# Patient Record
Sex: Male | Born: 1960 | Race: White | Hispanic: No | Smoking: Current every day smoker
Health system: Southern US, Community
[De-identification: ages and names within clinical notes are randomized; demographics above are authoritative.]

## PROBLEM LIST (undated history)

## (undated) DIAGNOSIS — Z9049 Acquired absence of other specified parts of digestive tract: Secondary | ICD-10-CM

## (undated) DIAGNOSIS — M1990 Unspecified osteoarthritis, unspecified site: Secondary | ICD-10-CM

## (undated) DIAGNOSIS — E039 Hypothyroidism, unspecified: Secondary | ICD-10-CM

## (undated) DIAGNOSIS — I519 Heart disease, unspecified: Secondary | ICD-10-CM

## (undated) DIAGNOSIS — K219 Gastro-esophageal reflux disease without esophagitis: Secondary | ICD-10-CM

## (undated) DIAGNOSIS — R0600 Dyspnea, unspecified: Secondary | ICD-10-CM

## (undated) DIAGNOSIS — G4730 Sleep apnea, unspecified: Secondary | ICD-10-CM

## (undated) DIAGNOSIS — I2510 Atherosclerotic heart disease of native coronary artery without angina pectoris: Secondary | ICD-10-CM

## (undated) DIAGNOSIS — D496 Neoplasm of unspecified behavior of brain: Secondary | ICD-10-CM

## (undated) DIAGNOSIS — J449 Chronic obstructive pulmonary disease, unspecified: Secondary | ICD-10-CM

## (undated) DIAGNOSIS — I10 Essential (primary) hypertension: Secondary | ICD-10-CM

## (undated) DIAGNOSIS — I219 Acute myocardial infarction, unspecified: Secondary | ICD-10-CM

## (undated) DIAGNOSIS — I2699 Other pulmonary embolism without acute cor pulmonale: Secondary | ICD-10-CM

## (undated) DIAGNOSIS — E119 Type 2 diabetes mellitus without complications: Secondary | ICD-10-CM

## (undated) DIAGNOSIS — E7800 Pure hypercholesterolemia, unspecified: Secondary | ICD-10-CM

## (undated) HISTORY — DX: Pure hypercholesterolemia, unspecified: E78.00

## (undated) HISTORY — DX: Heart disease, unspecified: I51.9

## (undated) HISTORY — DX: Essential (primary) hypertension: I10

## (undated) HISTORY — DX: Other pulmonary embolism without acute cor pulmonale: I26.99

## (undated) HISTORY — PX: APPENDECTOMY: SHX54

## (undated) HISTORY — DX: Chronic obstructive pulmonary disease, unspecified: J44.9

## (undated) HISTORY — DX: Sleep apnea, unspecified: G47.30

## (undated) HISTORY — DX: Neoplasm of unspecified behavior of brain: D49.6

## (undated) HISTORY — DX: Acquired absence of other specified parts of digestive tract: Z90.49

## (undated) HISTORY — DX: Type 2 diabetes mellitus without complications: E11.9

---

## 2000-06-17 ENCOUNTER — Ambulatory Visit (HOSPITAL_COMMUNITY): Admission: RE | Admit: 2000-06-17 | Discharge: 2000-06-18 | Payer: Self-pay | Admitting: Neurosurgery

## 2000-07-14 ENCOUNTER — Inpatient Hospital Stay (HOSPITAL_COMMUNITY): Admission: RE | Admit: 2000-07-14 | Discharge: 2000-07-16 | Payer: Self-pay | Admitting: Neurosurgery

## 2000-10-25 ENCOUNTER — Encounter: Payer: Self-pay | Admitting: Neurosurgery

## 2000-10-25 ENCOUNTER — Ambulatory Visit (HOSPITAL_COMMUNITY): Admission: RE | Admit: 2000-10-25 | Discharge: 2000-10-25 | Payer: Self-pay | Admitting: Neurosurgery

## 2001-10-13 HISTORY — PX: TRANSPHENOIDAL PITUITARY RESECTION: SHX2572

## 2002-04-19 ENCOUNTER — Ambulatory Visit (HOSPITAL_COMMUNITY): Admission: RE | Admit: 2002-04-19 | Discharge: 2002-04-19 | Payer: Self-pay | Admitting: Family Medicine

## 2002-04-19 ENCOUNTER — Encounter: Payer: Self-pay | Admitting: Family Medicine

## 2011-12-16 ENCOUNTER — Encounter: Payer: Self-pay | Admitting: Surgery

## 2011-12-17 ENCOUNTER — Encounter: Payer: Self-pay | Admitting: Surgery

## 2011-12-30 ENCOUNTER — Ambulatory Visit (INDEPENDENT_AMBULATORY_CARE_PROVIDER_SITE_OTHER): Payer: Medicare Other | Admitting: Cardiothoracic Surgery

## 2011-12-30 ENCOUNTER — Encounter: Payer: Self-pay | Admitting: Cardiothoracic Surgery

## 2011-12-30 VITALS — BP 130/78 | HR 74 | Resp 18

## 2011-12-30 DIAGNOSIS — E119 Type 2 diabetes mellitus without complications: Secondary | ICD-10-CM | POA: Insufficient documentation

## 2011-12-30 DIAGNOSIS — I519 Heart disease, unspecified: Secondary | ICD-10-CM

## 2011-12-30 DIAGNOSIS — J984 Other disorders of lung: Secondary | ICD-10-CM

## 2011-12-30 DIAGNOSIS — G4730 Sleep apnea, unspecified: Secondary | ICD-10-CM

## 2011-12-30 DIAGNOSIS — I10 Essential (primary) hypertension: Secondary | ICD-10-CM | POA: Insufficient documentation

## 2011-12-30 DIAGNOSIS — J449 Chronic obstructive pulmonary disease, unspecified: Secondary | ICD-10-CM | POA: Insufficient documentation

## 2011-12-30 DIAGNOSIS — Z86711 Personal history of pulmonary embolism: Secondary | ICD-10-CM

## 2011-12-30 DIAGNOSIS — E7800 Pure hypercholesterolemia, unspecified: Secondary | ICD-10-CM

## 2011-12-30 HISTORY — DX: Chronic obstructive pulmonary disease, unspecified: J44.9

## 2011-12-30 HISTORY — DX: Other disorders of lung: J98.4

## 2011-12-30 HISTORY — DX: Personal history of pulmonary embolism: Z86.711

## 2011-12-30 NOTE — Progress Notes (Signed)
[address]            [place] [address]          [phone]      Matthew Adams [place] Medical Record [id number] Date of Birth: 06-27-1961  Referring: Gardiner Rhyme, MD Primary Care: No primary provider on file.  Chief Complaint:    Chief Complaint  Patient presents with  . 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 These recently quit smoking which she had been doing for many years in addition to occupational exposure to significant amount of wood dust. He denies any direct exposure to tuberculosis or any history of tuberculosis. He notes that recent hospitalization he had a skin test done for tuberculosis reportedly negative. Recent CT scan shows some collapse of the predominate cystic areas in the right upper lobe. The patient has no current pulmonary symptoms other than chronic shortness of breath with exertion. He's had no fever chills or other indications of pulmonary infection. He sent for thoracic consultation and consideration of resection of right upper lobe.          Past Medical History  Diagnosis Date  . Diabetes mellitus   . Heart disease     Coronary stents placed 2006  . History of appendectomy   . Hypertension   . Sleep apnea   . Hypercholesterolemia   . 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 Pulmonary embolism   . COPD (chronic obstructive pulmonary disease)     Past Surgical History  Procedure Date  . Appendectomy   . Transphenoidal pituitary resection 2003    recurrance treated with gamma knife    Family History  Problem Relation Age of Onset  . Heart disease Father        History  Smoking status  . Former Smoker -- 2.0 packs/day for 25 years  . Quit date: 11/10/2011  Smokeless tobacco  . Never Used    History  Alcohol Use No     Allergies no known allergies  Current Outpatient Prescriptions  Medication Sig Dispense Refill  . CLINDAMYCIN HCL PO Take 300 mg by mouth every 8 (eight) hours.      . Fluticasone-Salmeterol (ADVAIR DISKUS) 250-50 MCG/DOSE AEPB Inhale 1 puff into the lungs 2 (two) times daily.      Marland Kitchen ipratropium-albuterol (DUONEB) 0.5-2.5 (3) MG/3ML SOLN Take 3 mLs by nebulization every 4 (four) hours as needed.      . warfarin (COUMADIN) 10 MG tablet Take 10 mg by mouth. 15mg  M,W,F  10mg  Tue,Thur, and Sun           Review of Systems:     Cardiac Review of Systems: Y or N  Chest Pain [ n   ]  Resting SOB [n   ] Exertional SOB  Blue.Reese  ]  Orthopnea Blue.Reese  ]   Pedal Edema [  n ]    Palpitations Florencio.Farrier  ] Syncope  [ n ]   Presyncope [ n  ]  General  Review of Systems: [Y] = yes [  ]=no Constitional: recent weight change [  n]; anorexia Blue.Reese  ]; fatigue [ n ]; nausea [n  ]; night sweats [ n ]; fever [  n]; or chills [ n ];                                                                                                                                          Dental: poor dentition[ n ];   Eye : blurred vision [  y]; diplopia [  y ]; vision changes [ y ];  Amaurosis fugax[  ]; Resp: cough [  y];  wheezing[y  ];  hemoptysis[n]; shortness of breath[ y ]; paroxysmal nocturnal dyspnea[  ]; dyspnea on exertion[  ]; or orthopnea[  ];  GI:  gallstones[  ], vomiting[  ];  dysphagia[  ]; melena[  ];  hematochezia [  ]; heartburn[  ];   Hx of  Colonoscopy[  ]; GU: kidney stones [  ]; hematuria[  ];   dysuria [  ];  nocturia[  ];  history of     obstruction [  ];             Skin: rash, swelling[  ];, hair loss[  ];  peripheral edema[  ];  or itching[  ]; Musculosketetal: myalgias[  ];  joint swelling[  ];  joint erythema[  ];  joint pain[  ];  back pain[   ];  Heme/Lymph: bruising[  ];  bleeding[  ];  anemia[  ];  Neuro: TIA[  ];  headaches[  ];  stroke[  ];  vertigo[  ];  seizures[  ];   paresthesias[  ];  difficulty walking[  ];  Psych:depression[  ]; anxiety[  ];  Endocrine: diabetes[  ];  thyroid dysfunction[  ];  Immunizations: Flu [  ]; Pneumococcal[  ];  Other:  Physical Exam: BP 130/78  Pulse 74  Resp 18  SpO2 92%  General appearance: alert, cooperative and appears older than stated age Neurologic: intact Heart: regular rate and rhythm, S1, S2 normal, no murmur, click, rub or gallop Lungs: clear to auscultation bilaterally Abdomen: soft, non-tender; bowel sounds normal; no masses,  no organomegaly Extremities: extremities normal, atraumatic, no cyanosis or edema and Homans sign is negative, no sign of DVT   Diagnostic Studies & Laboratory data:     Recent Radiology Findings:   Clinical Data: Follow-up to lung infection.  < CT CHEST WITH CONTRAST 11/06/2011  Technique: Multidetector CT imaging of the chest was performed following the standard protocol during bolus administration of intravenous contrast.  Contrast: 50 ml Isovue 370  Comparison: Prior chest CTs 07/24/2011 and 08/20/2011  Findings: The chest wall is unremarkable and stable. No supraclavicular or axillary lymphadenopathy. The bony thorax is intact.  The heart is normal in size. No pericardial effusion. Stable scattered mediastinal and hilar lymph  nodes. The esophagus is grossly normal and stable. Dense coronary artery calcifications are noted along with a coronary stent. The pulmonary arteries appear normal. The aorta is normal in caliber. No dissection.  The large infected apical bulla on the right has contracted somewhat. The thick-walled cavitary lesion along the major fissure is also contracted. However, there is been a significant increase and airspace opacity in the right upper lobe with extensive air bronchograms. Findings could be due to  rupture of the large infected apical bulla and subsequent new areas of pneumonia / pneumonitis. Severe underlying bullous emphysema changes are again demonstrated. The left lung is clear. Stable emphysematous changes and scarring. The right lower lobe is also clear. No pleural effusions. No pneumothorax.  The upper abdomen is unremarkable.  IMPRESSION:  1. Interval significant increase in airspace opacity in the right upper lobe with dense areas of airspace consolidation, ground-glass opacity and air bronchograms suggesting recurrent infection 2. Interval contraction of the large right apical bulla and thick- walled cavitary lesion. 3. Borderline enlarged, likely inflamed mediastinal and hilar lymph nodes. 4. Stable underlying emphysematous changes.    Recent Lab Findings: No results found for this basename: WBC,  HGB,  HCT,  PLT,  GLUCOSE,  CHOL,  TRIG,  HDL,  LDLDIRECT,  LDLCALC,  ALT,  AST,  NA,  K,  CL,  CREATININE,  BUN,  CO2,  TSH,  INR,  GLUF,  HGBA1C      Assessment / Plan:     Patient with long-term cigarette use history of severe COPD and chronic cavitation of the right upper lobe now somewhat collapsed. At this point the patient is not having any active symptoms of pulmonary infection. I've encouraged him in his endeavor to stop smoking which he appears to done for at least the past month. This point I see no reason to proceed with surgical risk section of lung tissue, it's unlikely to improve his overall respiratory status. I do plan to see him back in 3 months with a followup chest x-ray.       Grace Isaac MD  Beeper [phone] Office [phone] 12/30/2011 3:56 PM

## 2011-12-30 NOTE — Progress Notes (Deleted)
[address]            [place] [address]          [phone]      Jamiah C Coppens [place] Medical Record [id number] Date of Birth: 12-May-1961  Referring: Gardiner Rhyme, MD Primary Care: No primary provider on file.  Chief Complaint:    Chief Complaint  Patient presents with  . Lung Lesion    cavitary lung disease     History of Present Illness:              Past Medical History  Diagnosis Date  . Diabetes mellitus   . Heart disease     Coronary stents placed 2006  . History of appendectomy   . Hypertension   . Sleep apnea   . Hypercholesterolemia   . 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 Pulmonary embolism   . COPD (chronic obstructive pulmonary disease)     Past Surgical History  Procedure Date  . Appendectomy   . Transphenoidal pituitary resection 2003    recurrance treated with gamma knife    No family history on file.  History   Social History  . Marital Status: Divorced    Spouse Name: N/A    Number of Children: N/A  . Years of Education: N/A   Occupational History  . Not on file.   Social History Main Topics  . Smoking status: Former Smoker -- 2.0 packs/day for 25 years    Quit date: 11/10/2011  . Smokeless tobacco: Not on file  . Alcohol Use: No  . Drug Use: Not on file  . Sexually Active: Not on file   Other Topics Concern  . Not on file   Social History Narrative  . No narrative on file    History  Smoking status  . Former Smoker -- 2.0 packs/day for 25 years  . Quit date: 11/10/2011  Smokeless tobacco  . Not on file    History  Alcohol Use No     Allergies no known allergies  Current Outpatient Prescriptions  Medication Sig Dispense Refill  . CLINDAMYCIN HCL PO Take 300 mg by mouth every 8 (eight) hours.      . Fluticasone-Salmeterol (ADVAIR DISKUS) 250-50 MCG/DOSE AEPB Inhale 1 puff into the lungs 2 (two) times daily.      Marland Kitchen  ipratropium-albuterol (DUONEB) 0.5-2.5 (3) MG/3ML SOLN Take 3 mLs by nebulization every 4 (four) hours as needed.      . warfarin (COUMADIN) 10 MG tablet Take 10 mg by mouth. 15mg  M,W,F  10mg  Tue,Thur, and Sun           Review of Systems:     Cardiac Review of Systems: Y or N  Chest Pain [    ]  Resting SOB [   ] Exertional SOB  [  ]  Orthopnea [  ]   Pedal Edema [   ]    Palpitations [  ] Syncope  [  ]   Presyncope [   ]  General Review of Systems: [Y] = yes [  ]=no Constitional: recent weight change [  ]; anorexia [  ]; fatigue [  ]; nausea [  ]; night sweats [  ]; fever [  ]; or chills [  ];  Dental: poor dentition[  ]; Last Dentist visit: ***  Eye : blurred vision [  ]; diplopia [   ]; vision changes [  ];  Amaurosis fugax[  ]; Resp: cough [  ];  wheezing[  ];  hemoptysis[  ]; shortness of breath[  ]; paroxysmal nocturnal dyspnea[  ]; dyspnea on exertion[  ]; or orthopnea[  ];  GI:  gallstones[  ], vomiting[  ];  dysphagia[  ]; melena[  ];  hematochezia [  ]; heartburn[  ];   Hx of  Colonoscopy[  ]; GU: kidney stones [  ]; hematuria[  ];   dysuria [  ];  nocturia[  ];  history of     obstruction [  ];             Skin: rash, swelling[  ];, hair loss[  ];  peripheral edema[  ];  or itching[  ]; Musculosketetal: myalgias[  ];  joint swelling[  ];  joint erythema[  ];  joint pain[  ];  back pain[  ];  Heme/Lymph: bruising[  ];  bleeding[  ];  anemia[  ];  Neuro: TIA[  ];  headaches[  ];  stroke[  ];  vertigo[  ];  seizures[  ];   paresthesias[  ];  difficulty walking[  ];  Psych:depression[  ]; anxiety[  ];  Endocrine: diabetes[  ];  thyroid dysfunction[  ];  Immunizations: Flu [  ]; Pneumococcal[  ];  Other:  Physical Exam: BP 130/78  Pulse 74  Resp 18  SpO2 92%  {Physical Exam:3041110}   Diagnostic Studies & Laboratory data:     Recent  Radiology Findings:   No results found.    Recent Lab Findings: No results found for this basename: WBC, HGB, HCT, PLT, GLUCOSE, CHOL, TRIG, HDL, LDLDIRECT, LDLCALC, ALT, AST, NA, K, CL, CREATININE, BUN, CO2, TSH, INR, GLUF, HGBA1C      Assessment / Plan:            Grace Isaac MD  Beeper [phone] Office [phone] 12/30/2011 3:49 PM

## 2012-10-13 HISTORY — PX: CORONARY ARTERY BYPASS GRAFT: SHX141

## 2013-04-12 DIAGNOSIS — Z8701 Personal history of pneumonia (recurrent): Secondary | ICD-10-CM

## 2013-04-12 HISTORY — DX: Personal history of pneumonia (recurrent): Z87.01

## 2013-05-18 DIAGNOSIS — I2510 Atherosclerotic heart disease of native coronary artery without angina pectoris: Secondary | ICD-10-CM | POA: Insufficient documentation

## 2013-05-18 HISTORY — DX: Atherosclerotic heart disease of native coronary artery without angina pectoris: I25.10

## 2015-01-25 DIAGNOSIS — R3129 Other microscopic hematuria: Secondary | ICD-10-CM | POA: Insufficient documentation

## 2015-01-25 DIAGNOSIS — E291 Testicular hypofunction: Secondary | ICD-10-CM | POA: Insufficient documentation

## 2015-01-25 DIAGNOSIS — R972 Elevated prostate specific antigen [PSA]: Secondary | ICD-10-CM

## 2015-01-25 HISTORY — DX: Other microscopic hematuria: R31.29

## 2015-01-25 HISTORY — DX: Elevated prostate specific antigen (PSA): R97.20

## 2015-01-25 HISTORY — DX: Testicular hypofunction: E29.1

## 2015-04-24 DIAGNOSIS — R319 Hematuria, unspecified: Secondary | ICD-10-CM

## 2015-04-24 HISTORY — DX: Hematuria, unspecified: R31.9

## 2015-06-04 DIAGNOSIS — E119 Type 2 diabetes mellitus without complications: Secondary | ICD-10-CM

## 2015-06-04 DIAGNOSIS — Z951 Presence of aortocoronary bypass graft: Secondary | ICD-10-CM

## 2015-06-04 HISTORY — DX: Presence of aortocoronary bypass graft: Z95.1

## 2015-06-04 HISTORY — DX: Type 2 diabetes mellitus without complications: E11.9

## 2015-07-17 ENCOUNTER — Other Ambulatory Visit: Payer: Self-pay

## 2015-12-04 DIAGNOSIS — E785 Hyperlipidemia, unspecified: Secondary | ICD-10-CM | POA: Insufficient documentation

## 2016-01-05 DIAGNOSIS — E038 Other specified hypothyroidism: Secondary | ICD-10-CM | POA: Insufficient documentation

## 2016-01-05 DIAGNOSIS — Z8639 Personal history of other endocrine, nutritional and metabolic disease: Secondary | ICD-10-CM

## 2016-01-05 HISTORY — DX: Other specified hypothyroidism: E03.8

## 2016-01-05 HISTORY — DX: Personal history of other endocrine, nutritional and metabolic disease: Z86.39

## 2016-02-01 ENCOUNTER — Other Ambulatory Visit: Payer: Self-pay

## 2016-10-14 DIAGNOSIS — E038 Other specified hypothyroidism: Secondary | ICD-10-CM | POA: Diagnosis not present

## 2016-10-14 DIAGNOSIS — E291 Testicular hypofunction: Secondary | ICD-10-CM | POA: Diagnosis not present

## 2016-10-14 DIAGNOSIS — E220 Acromegaly and pituitary gigantism: Secondary | ICD-10-CM | POA: Diagnosis not present

## 2016-11-06 DIAGNOSIS — E291 Testicular hypofunction: Secondary | ICD-10-CM | POA: Diagnosis not present

## 2016-11-06 DIAGNOSIS — R972 Elevated prostate specific antigen [PSA]: Secondary | ICD-10-CM | POA: Diagnosis not present

## 2016-11-20 DIAGNOSIS — G4733 Obstructive sleep apnea (adult) (pediatric): Secondary | ICD-10-CM | POA: Diagnosis not present

## 2016-11-21 DIAGNOSIS — R0982 Postnasal drip: Secondary | ICD-10-CM | POA: Diagnosis not present

## 2016-11-21 DIAGNOSIS — R918 Other nonspecific abnormal finding of lung field: Secondary | ICD-10-CM | POA: Diagnosis not present

## 2016-11-21 DIAGNOSIS — G4733 Obstructive sleep apnea (adult) (pediatric): Secondary | ICD-10-CM | POA: Diagnosis not present

## 2016-11-21 DIAGNOSIS — J449 Chronic obstructive pulmonary disease, unspecified: Secondary | ICD-10-CM | POA: Diagnosis not present

## 2016-11-25 DIAGNOSIS — E785 Hyperlipidemia, unspecified: Secondary | ICD-10-CM | POA: Diagnosis not present

## 2016-11-25 DIAGNOSIS — E119 Type 2 diabetes mellitus without complications: Secondary | ICD-10-CM | POA: Diagnosis not present

## 2016-11-25 DIAGNOSIS — Z951 Presence of aortocoronary bypass graft: Secondary | ICD-10-CM | POA: Diagnosis not present

## 2016-11-25 DIAGNOSIS — I2510 Atherosclerotic heart disease of native coronary artery without angina pectoris: Secondary | ICD-10-CM | POA: Diagnosis not present

## 2016-11-28 DIAGNOSIS — E038 Other specified hypothyroidism: Secondary | ICD-10-CM | POA: Diagnosis not present

## 2016-11-28 DIAGNOSIS — Z87898 Personal history of other specified conditions: Secondary | ICD-10-CM | POA: Diagnosis not present

## 2016-11-28 DIAGNOSIS — E291 Testicular hypofunction: Secondary | ICD-10-CM | POA: Diagnosis not present

## 2016-11-28 DIAGNOSIS — I119 Hypertensive heart disease without heart failure: Secondary | ICD-10-CM | POA: Diagnosis not present

## 2016-11-28 DIAGNOSIS — Z79899 Other long term (current) drug therapy: Secondary | ICD-10-CM | POA: Diagnosis not present

## 2016-11-28 DIAGNOSIS — E1142 Type 2 diabetes mellitus with diabetic polyneuropathy: Secondary | ICD-10-CM | POA: Diagnosis not present

## 2016-11-28 DIAGNOSIS — K210 Gastro-esophageal reflux disease with esophagitis: Secondary | ICD-10-CM | POA: Diagnosis not present

## 2016-11-28 DIAGNOSIS — E559 Vitamin D deficiency, unspecified: Secondary | ICD-10-CM | POA: Diagnosis not present

## 2016-11-28 DIAGNOSIS — E785 Hyperlipidemia, unspecified: Secondary | ICD-10-CM | POA: Diagnosis not present

## 2016-11-28 DIAGNOSIS — J449 Chronic obstructive pulmonary disease, unspecified: Secondary | ICD-10-CM | POA: Diagnosis not present

## 2016-11-28 DIAGNOSIS — I2782 Chronic pulmonary embolism: Secondary | ICD-10-CM | POA: Diagnosis not present

## 2016-12-02 DIAGNOSIS — J189 Pneumonia, unspecified organism: Secondary | ICD-10-CM | POA: Diagnosis not present

## 2016-12-02 DIAGNOSIS — R05 Cough: Secondary | ICD-10-CM | POA: Diagnosis not present

## 2016-12-02 DIAGNOSIS — J449 Chronic obstructive pulmonary disease, unspecified: Secondary | ICD-10-CM | POA: Diagnosis not present

## 2016-12-23 DIAGNOSIS — J189 Pneumonia, unspecified organism: Secondary | ICD-10-CM | POA: Diagnosis not present

## 2016-12-23 DIAGNOSIS — S60812A Abrasion of left wrist, initial encounter: Secondary | ICD-10-CM | POA: Diagnosis not present

## 2017-01-27 DIAGNOSIS — E291 Testicular hypofunction: Secondary | ICD-10-CM | POA: Diagnosis not present

## 2017-01-27 DIAGNOSIS — E038 Other specified hypothyroidism: Secondary | ICD-10-CM | POA: Diagnosis not present

## 2017-01-27 DIAGNOSIS — E236 Other disorders of pituitary gland: Secondary | ICD-10-CM | POA: Diagnosis not present

## 2017-01-29 DIAGNOSIS — E038 Other specified hypothyroidism: Secondary | ICD-10-CM | POA: Diagnosis not present

## 2017-01-29 DIAGNOSIS — E236 Other disorders of pituitary gland: Secondary | ICD-10-CM | POA: Diagnosis not present

## 2017-01-29 DIAGNOSIS — E291 Testicular hypofunction: Secondary | ICD-10-CM | POA: Diagnosis not present

## 2017-03-18 DIAGNOSIS — K2270 Barrett's esophagus without dysplasia: Secondary | ICD-10-CM | POA: Diagnosis not present

## 2017-03-18 DIAGNOSIS — K219 Gastro-esophageal reflux disease without esophagitis: Secondary | ICD-10-CM | POA: Diagnosis not present

## 2017-03-18 DIAGNOSIS — K5730 Diverticulosis of large intestine without perforation or abscess without bleeding: Secondary | ICD-10-CM | POA: Diagnosis not present

## 2017-03-24 DIAGNOSIS — G4733 Obstructive sleep apnea (adult) (pediatric): Secondary | ICD-10-CM | POA: Diagnosis not present

## 2017-03-25 DIAGNOSIS — R918 Other nonspecific abnormal finding of lung field: Secondary | ICD-10-CM | POA: Diagnosis not present

## 2017-03-25 DIAGNOSIS — G4733 Obstructive sleep apnea (adult) (pediatric): Secondary | ICD-10-CM | POA: Diagnosis not present

## 2017-03-25 DIAGNOSIS — R0982 Postnasal drip: Secondary | ICD-10-CM | POA: Diagnosis not present

## 2017-03-25 DIAGNOSIS — J449 Chronic obstructive pulmonary disease, unspecified: Secondary | ICD-10-CM | POA: Diagnosis not present

## 2017-04-02 DIAGNOSIS — D497 Neoplasm of unspecified behavior of endocrine glands and other parts of nervous system: Secondary | ICD-10-CM | POA: Diagnosis not present

## 2017-04-02 DIAGNOSIS — Z7982 Long term (current) use of aspirin: Secondary | ICD-10-CM | POA: Diagnosis not present

## 2017-04-02 DIAGNOSIS — K2270 Barrett's esophagus without dysplasia: Secondary | ICD-10-CM | POA: Diagnosis not present

## 2017-04-02 DIAGNOSIS — Z86711 Personal history of pulmonary embolism: Secondary | ICD-10-CM | POA: Diagnosis not present

## 2017-04-02 DIAGNOSIS — Z79899 Other long term (current) drug therapy: Secondary | ICD-10-CM | POA: Diagnosis not present

## 2017-04-02 DIAGNOSIS — Z800 Family history of malignant neoplasm of digestive organs: Secondary | ICD-10-CM | POA: Diagnosis not present

## 2017-04-02 DIAGNOSIS — Z951 Presence of aortocoronary bypass graft: Secondary | ICD-10-CM | POA: Diagnosis not present

## 2017-04-02 DIAGNOSIS — K648 Other hemorrhoids: Secondary | ICD-10-CM | POA: Diagnosis not present

## 2017-04-02 DIAGNOSIS — E119 Type 2 diabetes mellitus without complications: Secondary | ICD-10-CM | POA: Diagnosis not present

## 2017-04-02 DIAGNOSIS — Z86010 Personal history of colonic polyps: Secondary | ICD-10-CM | POA: Diagnosis not present

## 2017-04-02 DIAGNOSIS — Z1211 Encounter for screening for malignant neoplasm of colon: Secondary | ICD-10-CM | POA: Diagnosis not present

## 2017-04-02 DIAGNOSIS — I519 Heart disease, unspecified: Secondary | ICD-10-CM | POA: Diagnosis not present

## 2017-04-02 DIAGNOSIS — K5730 Diverticulosis of large intestine without perforation or abscess without bleeding: Secondary | ICD-10-CM | POA: Diagnosis not present

## 2017-04-27 DIAGNOSIS — R972 Elevated prostate specific antigen [PSA]: Secondary | ICD-10-CM | POA: Diagnosis not present

## 2017-05-06 DIAGNOSIS — R972 Elevated prostate specific antigen [PSA]: Secondary | ICD-10-CM | POA: Diagnosis not present

## 2017-05-06 DIAGNOSIS — E291 Testicular hypofunction: Secondary | ICD-10-CM | POA: Diagnosis not present

## 2017-05-22 DIAGNOSIS — J189 Pneumonia, unspecified organism: Secondary | ICD-10-CM | POA: Diagnosis not present

## 2017-05-22 DIAGNOSIS — E119 Type 2 diabetes mellitus without complications: Secondary | ICD-10-CM | POA: Diagnosis not present

## 2017-05-22 DIAGNOSIS — R05 Cough: Secondary | ICD-10-CM | POA: Diagnosis not present

## 2017-05-22 DIAGNOSIS — R0602 Shortness of breath: Secondary | ICD-10-CM | POA: Diagnosis not present

## 2017-05-22 DIAGNOSIS — J439 Emphysema, unspecified: Secondary | ICD-10-CM | POA: Diagnosis not present

## 2017-06-12 DIAGNOSIS — J449 Chronic obstructive pulmonary disease, unspecified: Secondary | ICD-10-CM | POA: Diagnosis not present

## 2017-06-12 DIAGNOSIS — G4733 Obstructive sleep apnea (adult) (pediatric): Secondary | ICD-10-CM | POA: Diagnosis not present

## 2017-06-12 DIAGNOSIS — R918 Other nonspecific abnormal finding of lung field: Secondary | ICD-10-CM | POA: Diagnosis not present

## 2017-06-17 DIAGNOSIS — E784 Other hyperlipidemia: Secondary | ICD-10-CM | POA: Diagnosis not present

## 2017-06-17 DIAGNOSIS — F17210 Nicotine dependence, cigarettes, uncomplicated: Secondary | ICD-10-CM | POA: Diagnosis not present

## 2017-06-17 DIAGNOSIS — G4733 Obstructive sleep apnea (adult) (pediatric): Secondary | ICD-10-CM | POA: Diagnosis not present

## 2017-06-17 DIAGNOSIS — I2510 Atherosclerotic heart disease of native coronary artery without angina pectoris: Secondary | ICD-10-CM | POA: Diagnosis not present

## 2017-06-17 DIAGNOSIS — Z7901 Long term (current) use of anticoagulants: Secondary | ICD-10-CM | POA: Insufficient documentation

## 2017-06-17 HISTORY — DX: Long term (current) use of anticoagulants: Z79.01

## 2017-06-18 DIAGNOSIS — Z720 Tobacco use: Secondary | ICD-10-CM | POA: Diagnosis not present

## 2017-06-18 DIAGNOSIS — Z1211 Encounter for screening for malignant neoplasm of colon: Secondary | ICD-10-CM | POA: Diagnosis not present

## 2017-06-18 DIAGNOSIS — E1142 Type 2 diabetes mellitus with diabetic polyneuropathy: Secondary | ICD-10-CM | POA: Diagnosis not present

## 2017-06-18 DIAGNOSIS — E559 Vitamin D deficiency, unspecified: Secondary | ICD-10-CM | POA: Diagnosis not present

## 2017-06-18 DIAGNOSIS — Z125 Encounter for screening for malignant neoplasm of prostate: Secondary | ICD-10-CM | POA: Diagnosis not present

## 2017-06-18 DIAGNOSIS — Z1389 Encounter for screening for other disorder: Secondary | ICD-10-CM | POA: Diagnosis not present

## 2017-06-18 DIAGNOSIS — E785 Hyperlipidemia, unspecified: Secondary | ICD-10-CM | POA: Diagnosis not present

## 2017-06-18 DIAGNOSIS — E669 Obesity, unspecified: Secondary | ICD-10-CM | POA: Diagnosis not present

## 2017-06-18 DIAGNOSIS — E038 Other specified hypothyroidism: Secondary | ICD-10-CM | POA: Diagnosis not present

## 2017-06-18 DIAGNOSIS — I119 Hypertensive heart disease without heart failure: Secondary | ICD-10-CM | POA: Diagnosis not present

## 2017-06-18 DIAGNOSIS — Z0000 Encounter for general adult medical examination without abnormal findings: Secondary | ICD-10-CM | POA: Diagnosis not present

## 2017-06-18 DIAGNOSIS — F17200 Nicotine dependence, unspecified, uncomplicated: Secondary | ICD-10-CM | POA: Diagnosis not present

## 2017-07-01 DIAGNOSIS — Z6830 Body mass index (BMI) 30.0-30.9, adult: Secondary | ICD-10-CM | POA: Diagnosis not present

## 2017-07-01 DIAGNOSIS — E669 Obesity, unspecified: Secondary | ICD-10-CM | POA: Diagnosis not present

## 2017-07-01 DIAGNOSIS — Z136 Encounter for screening for cardiovascular disorders: Secondary | ICD-10-CM | POA: Diagnosis not present

## 2017-07-01 DIAGNOSIS — Z0000 Encounter for general adult medical examination without abnormal findings: Secondary | ICD-10-CM | POA: Diagnosis not present

## 2017-07-01 DIAGNOSIS — E785 Hyperlipidemia, unspecified: Secondary | ICD-10-CM | POA: Diagnosis not present

## 2017-07-01 DIAGNOSIS — Z1389 Encounter for screening for other disorder: Secondary | ICD-10-CM | POA: Diagnosis not present

## 2017-07-01 DIAGNOSIS — Z1211 Encounter for screening for malignant neoplasm of colon: Secondary | ICD-10-CM | POA: Diagnosis not present

## 2017-07-22 DIAGNOSIS — Z23 Encounter for immunization: Secondary | ICD-10-CM | POA: Diagnosis not present

## 2017-08-05 DIAGNOSIS — E236 Other disorders of pituitary gland: Secondary | ICD-10-CM | POA: Diagnosis not present

## 2017-08-05 DIAGNOSIS — E291 Testicular hypofunction: Secondary | ICD-10-CM | POA: Diagnosis not present

## 2017-08-05 DIAGNOSIS — E038 Other specified hypothyroidism: Secondary | ICD-10-CM | POA: Diagnosis not present

## 2017-08-05 DIAGNOSIS — E2749 Other adrenocortical insufficiency: Secondary | ICD-10-CM

## 2017-08-05 HISTORY — DX: Other adrenocortical insufficiency: E27.49

## 2017-08-06 DIAGNOSIS — E291 Testicular hypofunction: Secondary | ICD-10-CM | POA: Diagnosis not present

## 2017-08-06 DIAGNOSIS — E236 Other disorders of pituitary gland: Secondary | ICD-10-CM | POA: Diagnosis not present

## 2017-08-06 DIAGNOSIS — E2749 Other adrenocortical insufficiency: Secondary | ICD-10-CM | POA: Diagnosis not present

## 2017-08-06 DIAGNOSIS — E038 Other specified hypothyroidism: Secondary | ICD-10-CM | POA: Diagnosis not present

## 2017-08-19 DIAGNOSIS — E1142 Type 2 diabetes mellitus with diabetic polyneuropathy: Secondary | ICD-10-CM | POA: Diagnosis not present

## 2017-08-19 DIAGNOSIS — L989 Disorder of the skin and subcutaneous tissue, unspecified: Secondary | ICD-10-CM | POA: Diagnosis not present

## 2017-08-19 DIAGNOSIS — I2782 Chronic pulmonary embolism: Secondary | ICD-10-CM | POA: Diagnosis not present

## 2017-08-19 DIAGNOSIS — E291 Testicular hypofunction: Secondary | ICD-10-CM | POA: Diagnosis not present

## 2017-08-19 DIAGNOSIS — J449 Chronic obstructive pulmonary disease, unspecified: Secondary | ICD-10-CM | POA: Diagnosis not present

## 2017-08-19 DIAGNOSIS — L821 Other seborrheic keratosis: Secondary | ICD-10-CM | POA: Diagnosis not present

## 2017-08-19 DIAGNOSIS — I119 Hypertensive heart disease without heart failure: Secondary | ICD-10-CM | POA: Diagnosis not present

## 2017-08-19 DIAGNOSIS — E559 Vitamin D deficiency, unspecified: Secondary | ICD-10-CM | POA: Diagnosis not present

## 2017-08-19 DIAGNOSIS — E785 Hyperlipidemia, unspecified: Secondary | ICD-10-CM | POA: Diagnosis not present

## 2017-08-19 DIAGNOSIS — E038 Other specified hypothyroidism: Secondary | ICD-10-CM | POA: Diagnosis not present

## 2017-08-27 DIAGNOSIS — G4733 Obstructive sleep apnea (adult) (pediatric): Secondary | ICD-10-CM | POA: Diagnosis not present

## 2017-08-28 DIAGNOSIS — R0982 Postnasal drip: Secondary | ICD-10-CM | POA: Diagnosis not present

## 2017-08-28 DIAGNOSIS — G4733 Obstructive sleep apnea (adult) (pediatric): Secondary | ICD-10-CM | POA: Diagnosis not present

## 2017-08-28 DIAGNOSIS — J449 Chronic obstructive pulmonary disease, unspecified: Secondary | ICD-10-CM | POA: Diagnosis not present

## 2017-10-13 HISTORY — PX: CARDIAC CATHETERIZATION: SHX172

## 2017-12-04 DIAGNOSIS — J449 Chronic obstructive pulmonary disease, unspecified: Secondary | ICD-10-CM | POA: Diagnosis not present

## 2017-12-04 DIAGNOSIS — F17210 Nicotine dependence, cigarettes, uncomplicated: Secondary | ICD-10-CM | POA: Diagnosis not present

## 2017-12-04 DIAGNOSIS — G4733 Obstructive sleep apnea (adult) (pediatric): Secondary | ICD-10-CM | POA: Diagnosis not present

## 2017-12-08 DIAGNOSIS — E291 Testicular hypofunction: Secondary | ICD-10-CM | POA: Diagnosis not present

## 2017-12-08 DIAGNOSIS — E038 Other specified hypothyroidism: Secondary | ICD-10-CM | POA: Diagnosis not present

## 2017-12-08 DIAGNOSIS — E236 Other disorders of pituitary gland: Secondary | ICD-10-CM | POA: Diagnosis not present

## 2017-12-08 DIAGNOSIS — E2749 Other adrenocortical insufficiency: Secondary | ICD-10-CM | POA: Diagnosis not present

## 2017-12-16 DIAGNOSIS — Z951 Presence of aortocoronary bypass graft: Secondary | ICD-10-CM | POA: Diagnosis not present

## 2017-12-16 DIAGNOSIS — R0789 Other chest pain: Secondary | ICD-10-CM | POA: Insufficient documentation

## 2017-12-16 DIAGNOSIS — Z955 Presence of coronary angioplasty implant and graft: Secondary | ICD-10-CM | POA: Diagnosis not present

## 2017-12-16 DIAGNOSIS — I2510 Atherosclerotic heart disease of native coronary artery without angina pectoris: Secondary | ICD-10-CM | POA: Diagnosis not present

## 2017-12-16 DIAGNOSIS — E785 Hyperlipidemia, unspecified: Secondary | ICD-10-CM | POA: Diagnosis not present

## 2017-12-16 HISTORY — DX: Other chest pain: R07.89

## 2017-12-22 DIAGNOSIS — R0789 Other chest pain: Secondary | ICD-10-CM | POA: Diagnosis not present

## 2017-12-22 DIAGNOSIS — I2510 Atherosclerotic heart disease of native coronary artery without angina pectoris: Secondary | ICD-10-CM | POA: Diagnosis not present

## 2017-12-25 DIAGNOSIS — I2510 Atherosclerotic heart disease of native coronary artery without angina pectoris: Secondary | ICD-10-CM | POA: Diagnosis not present

## 2017-12-25 DIAGNOSIS — R9439 Abnormal result of other cardiovascular function study: Secondary | ICD-10-CM

## 2017-12-25 DIAGNOSIS — E785 Hyperlipidemia, unspecified: Secondary | ICD-10-CM | POA: Diagnosis not present

## 2017-12-25 DIAGNOSIS — Z7901 Long term (current) use of anticoagulants: Secondary | ICD-10-CM | POA: Diagnosis not present

## 2017-12-25 HISTORY — DX: Abnormal result of other cardiovascular function study: R94.39

## 2017-12-28 DIAGNOSIS — R9439 Abnormal result of other cardiovascular function study: Secondary | ICD-10-CM | POA: Diagnosis not present

## 2017-12-29 DIAGNOSIS — I2510 Atherosclerotic heart disease of native coronary artery without angina pectoris: Secondary | ICD-10-CM | POA: Diagnosis not present

## 2017-12-29 DIAGNOSIS — R9430 Abnormal result of cardiovascular function study, unspecified: Secondary | ICD-10-CM | POA: Diagnosis not present

## 2017-12-29 DIAGNOSIS — R918 Other nonspecific abnormal finding of lung field: Secondary | ICD-10-CM | POA: Diagnosis not present

## 2017-12-29 DIAGNOSIS — I25810 Atherosclerosis of coronary artery bypass graft(s) without angina pectoris: Secondary | ICD-10-CM | POA: Diagnosis not present

## 2017-12-29 DIAGNOSIS — I2582 Chronic total occlusion of coronary artery: Secondary | ICD-10-CM | POA: Diagnosis not present

## 2017-12-29 DIAGNOSIS — Z955 Presence of coronary angioplasty implant and graft: Secondary | ICD-10-CM | POA: Diagnosis not present

## 2017-12-29 DIAGNOSIS — I25119 Atherosclerotic heart disease of native coronary artery with unspecified angina pectoris: Secondary | ICD-10-CM | POA: Diagnosis not present

## 2017-12-29 DIAGNOSIS — I10 Essential (primary) hypertension: Secondary | ICD-10-CM | POA: Diagnosis not present

## 2017-12-29 DIAGNOSIS — Z79899 Other long term (current) drug therapy: Secondary | ICD-10-CM | POA: Diagnosis not present

## 2017-12-29 DIAGNOSIS — Z86718 Personal history of other venous thrombosis and embolism: Secondary | ICD-10-CM | POA: Diagnosis not present

## 2017-12-29 DIAGNOSIS — Z951 Presence of aortocoronary bypass graft: Secondary | ICD-10-CM | POA: Diagnosis not present

## 2017-12-29 DIAGNOSIS — F17210 Nicotine dependence, cigarettes, uncomplicated: Secondary | ICD-10-CM | POA: Diagnosis not present

## 2017-12-29 DIAGNOSIS — Z7901 Long term (current) use of anticoagulants: Secondary | ICD-10-CM | POA: Diagnosis not present

## 2017-12-29 DIAGNOSIS — E785 Hyperlipidemia, unspecified: Secondary | ICD-10-CM | POA: Diagnosis not present

## 2018-01-29 DIAGNOSIS — I2510 Atherosclerotic heart disease of native coronary artery without angina pectoris: Secondary | ICD-10-CM | POA: Diagnosis not present

## 2018-01-29 DIAGNOSIS — Z7901 Long term (current) use of anticoagulants: Secondary | ICD-10-CM | POA: Diagnosis not present

## 2018-01-29 DIAGNOSIS — Z951 Presence of aortocoronary bypass graft: Secondary | ICD-10-CM | POA: Diagnosis not present

## 2018-01-29 DIAGNOSIS — E785 Hyperlipidemia, unspecified: Secondary | ICD-10-CM | POA: Diagnosis not present

## 2018-02-26 DIAGNOSIS — E039 Hypothyroidism, unspecified: Secondary | ICD-10-CM | POA: Diagnosis not present

## 2018-02-26 DIAGNOSIS — E1142 Type 2 diabetes mellitus with diabetic polyneuropathy: Secondary | ICD-10-CM | POA: Diagnosis not present

## 2018-02-26 DIAGNOSIS — E291 Testicular hypofunction: Secondary | ICD-10-CM | POA: Diagnosis not present

## 2018-02-26 DIAGNOSIS — L989 Disorder of the skin and subcutaneous tissue, unspecified: Secondary | ICD-10-CM | POA: Diagnosis not present

## 2018-02-26 DIAGNOSIS — I119 Hypertensive heart disease without heart failure: Secondary | ICD-10-CM | POA: Diagnosis not present

## 2018-02-26 DIAGNOSIS — R739 Hyperglycemia, unspecified: Secondary | ICD-10-CM | POA: Diagnosis not present

## 2018-02-26 DIAGNOSIS — E559 Vitamin D deficiency, unspecified: Secondary | ICD-10-CM | POA: Diagnosis not present

## 2018-02-26 DIAGNOSIS — E785 Hyperlipidemia, unspecified: Secondary | ICD-10-CM | POA: Diagnosis not present

## 2018-02-26 DIAGNOSIS — I2782 Chronic pulmonary embolism: Secondary | ICD-10-CM | POA: Diagnosis not present

## 2018-02-26 DIAGNOSIS — J441 Chronic obstructive pulmonary disease with (acute) exacerbation: Secondary | ICD-10-CM | POA: Diagnosis not present

## 2018-02-26 DIAGNOSIS — E237 Disorder of pituitary gland, unspecified: Secondary | ICD-10-CM | POA: Diagnosis not present

## 2018-03-16 DIAGNOSIS — L820 Inflamed seborrheic keratosis: Secondary | ICD-10-CM | POA: Diagnosis not present

## 2018-03-26 DIAGNOSIS — R05 Cough: Secondary | ICD-10-CM | POA: Diagnosis not present

## 2018-03-26 DIAGNOSIS — G4730 Sleep apnea, unspecified: Secondary | ICD-10-CM | POA: Diagnosis not present

## 2018-03-26 DIAGNOSIS — F17210 Nicotine dependence, cigarettes, uncomplicated: Secondary | ICD-10-CM | POA: Diagnosis not present

## 2018-03-26 DIAGNOSIS — R0982 Postnasal drip: Secondary | ICD-10-CM | POA: Diagnosis not present

## 2018-03-26 DIAGNOSIS — J449 Chronic obstructive pulmonary disease, unspecified: Secondary | ICD-10-CM | POA: Diagnosis not present

## 2018-04-07 DIAGNOSIS — K5730 Diverticulosis of large intestine without perforation or abscess without bleeding: Secondary | ICD-10-CM | POA: Diagnosis not present

## 2018-04-07 DIAGNOSIS — K2270 Barrett's esophagus without dysplasia: Secondary | ICD-10-CM | POA: Diagnosis not present

## 2018-04-12 DIAGNOSIS — E038 Other specified hypothyroidism: Secondary | ICD-10-CM | POA: Diagnosis not present

## 2018-04-12 DIAGNOSIS — E291 Testicular hypofunction: Secondary | ICD-10-CM | POA: Diagnosis not present

## 2018-04-12 DIAGNOSIS — Z125 Encounter for screening for malignant neoplasm of prostate: Secondary | ICD-10-CM | POA: Diagnosis not present

## 2018-04-12 DIAGNOSIS — E893 Postprocedural hypopituitarism: Secondary | ICD-10-CM | POA: Diagnosis not present

## 2018-04-12 DIAGNOSIS — E2749 Other adrenocortical insufficiency: Secondary | ICD-10-CM | POA: Diagnosis not present

## 2018-04-30 DIAGNOSIS — Z951 Presence of aortocoronary bypass graft: Secondary | ICD-10-CM | POA: Diagnosis not present

## 2018-04-30 DIAGNOSIS — Z7901 Long term (current) use of anticoagulants: Secondary | ICD-10-CM | POA: Diagnosis not present

## 2018-04-30 DIAGNOSIS — E785 Hyperlipidemia, unspecified: Secondary | ICD-10-CM | POA: Diagnosis not present

## 2018-04-30 DIAGNOSIS — Z955 Presence of coronary angioplasty implant and graft: Secondary | ICD-10-CM | POA: Diagnosis not present

## 2018-04-30 DIAGNOSIS — I2510 Atherosclerotic heart disease of native coronary artery without angina pectoris: Secondary | ICD-10-CM | POA: Diagnosis not present

## 2018-05-18 DIAGNOSIS — H25043 Posterior subcapsular polar age-related cataract, bilateral: Secondary | ICD-10-CM | POA: Diagnosis not present

## 2018-05-18 DIAGNOSIS — H2513 Age-related nuclear cataract, bilateral: Secondary | ICD-10-CM | POA: Diagnosis not present

## 2018-06-04 DIAGNOSIS — G4733 Obstructive sleep apnea (adult) (pediatric): Secondary | ICD-10-CM | POA: Diagnosis not present

## 2018-06-04 DIAGNOSIS — F17210 Nicotine dependence, cigarettes, uncomplicated: Secondary | ICD-10-CM | POA: Diagnosis not present

## 2018-06-04 DIAGNOSIS — R05 Cough: Secondary | ICD-10-CM | POA: Diagnosis not present

## 2018-06-04 DIAGNOSIS — J449 Chronic obstructive pulmonary disease, unspecified: Secondary | ICD-10-CM | POA: Diagnosis not present

## 2018-06-04 DIAGNOSIS — R0982 Postnasal drip: Secondary | ICD-10-CM | POA: Diagnosis not present

## 2018-06-23 DIAGNOSIS — R972 Elevated prostate specific antigen [PSA]: Secondary | ICD-10-CM | POA: Diagnosis not present

## 2018-06-23 DIAGNOSIS — E291 Testicular hypofunction: Secondary | ICD-10-CM | POA: Diagnosis not present

## 2018-06-23 DIAGNOSIS — E039 Hypothyroidism, unspecified: Secondary | ICD-10-CM | POA: Diagnosis not present

## 2018-06-23 DIAGNOSIS — E559 Vitamin D deficiency, unspecified: Secondary | ICD-10-CM | POA: Diagnosis not present

## 2018-06-23 DIAGNOSIS — Z23 Encounter for immunization: Secondary | ICD-10-CM | POA: Diagnosis not present

## 2018-06-23 DIAGNOSIS — R739 Hyperglycemia, unspecified: Secondary | ICD-10-CM | POA: Diagnosis not present

## 2018-06-23 DIAGNOSIS — E237 Disorder of pituitary gland, unspecified: Secondary | ICD-10-CM | POA: Diagnosis not present

## 2018-06-23 DIAGNOSIS — E785 Hyperlipidemia, unspecified: Secondary | ICD-10-CM | POA: Diagnosis not present

## 2018-06-23 DIAGNOSIS — I2782 Chronic pulmonary embolism: Secondary | ICD-10-CM | POA: Diagnosis not present

## 2018-06-23 DIAGNOSIS — I119 Hypertensive heart disease without heart failure: Secondary | ICD-10-CM | POA: Diagnosis not present

## 2018-06-30 DIAGNOSIS — E291 Testicular hypofunction: Secondary | ICD-10-CM | POA: Diagnosis not present

## 2018-06-30 DIAGNOSIS — N401 Enlarged prostate with lower urinary tract symptoms: Secondary | ICD-10-CM | POA: Diagnosis not present

## 2018-06-30 DIAGNOSIS — Z79899 Other long term (current) drug therapy: Secondary | ICD-10-CM | POA: Diagnosis not present

## 2018-06-30 DIAGNOSIS — R972 Elevated prostate specific antigen [PSA]: Secondary | ICD-10-CM | POA: Diagnosis not present

## 2018-07-08 DIAGNOSIS — G4733 Obstructive sleep apnea (adult) (pediatric): Secondary | ICD-10-CM | POA: Diagnosis not present

## 2018-07-09 DIAGNOSIS — R0982 Postnasal drip: Secondary | ICD-10-CM | POA: Diagnosis not present

## 2018-07-09 DIAGNOSIS — G4733 Obstructive sleep apnea (adult) (pediatric): Secondary | ICD-10-CM | POA: Diagnosis not present

## 2018-07-09 DIAGNOSIS — J449 Chronic obstructive pulmonary disease, unspecified: Secondary | ICD-10-CM | POA: Diagnosis not present

## 2018-07-09 DIAGNOSIS — F17210 Nicotine dependence, cigarettes, uncomplicated: Secondary | ICD-10-CM | POA: Diagnosis not present

## 2018-07-09 DIAGNOSIS — R05 Cough: Secondary | ICD-10-CM | POA: Diagnosis not present

## 2018-07-15 DIAGNOSIS — R05 Cough: Secondary | ICD-10-CM | POA: Diagnosis not present

## 2018-07-16 DIAGNOSIS — J449 Chronic obstructive pulmonary disease, unspecified: Secondary | ICD-10-CM | POA: Diagnosis not present

## 2018-07-16 DIAGNOSIS — R05 Cough: Secondary | ICD-10-CM | POA: Diagnosis not present

## 2018-07-16 DIAGNOSIS — R0602 Shortness of breath: Secondary | ICD-10-CM | POA: Diagnosis not present

## 2018-08-03 DIAGNOSIS — R0982 Postnasal drip: Secondary | ICD-10-CM | POA: Diagnosis not present

## 2018-08-03 DIAGNOSIS — J449 Chronic obstructive pulmonary disease, unspecified: Secondary | ICD-10-CM | POA: Diagnosis not present

## 2018-08-03 DIAGNOSIS — R05 Cough: Secondary | ICD-10-CM | POA: Diagnosis not present

## 2018-08-03 DIAGNOSIS — J189 Pneumonia, unspecified organism: Secondary | ICD-10-CM | POA: Diagnosis not present

## 2018-08-03 DIAGNOSIS — F17210 Nicotine dependence, cigarettes, uncomplicated: Secondary | ICD-10-CM | POA: Diagnosis not present

## 2018-08-09 DIAGNOSIS — R918 Other nonspecific abnormal finding of lung field: Secondary | ICD-10-CM | POA: Diagnosis not present

## 2018-08-09 DIAGNOSIS — J439 Emphysema, unspecified: Secondary | ICD-10-CM | POA: Diagnosis not present

## 2018-08-10 DIAGNOSIS — J189 Pneumonia, unspecified organism: Secondary | ICD-10-CM | POA: Diagnosis not present

## 2018-08-10 DIAGNOSIS — J479 Bronchiectasis, uncomplicated: Secondary | ICD-10-CM | POA: Diagnosis not present

## 2018-08-10 DIAGNOSIS — G4733 Obstructive sleep apnea (adult) (pediatric): Secondary | ICD-10-CM | POA: Diagnosis not present

## 2018-08-10 DIAGNOSIS — J449 Chronic obstructive pulmonary disease, unspecified: Secondary | ICD-10-CM | POA: Diagnosis not present

## 2018-08-10 DIAGNOSIS — R918 Other nonspecific abnormal finding of lung field: Secondary | ICD-10-CM | POA: Diagnosis not present

## 2018-08-10 DIAGNOSIS — R05 Cough: Secondary | ICD-10-CM | POA: Diagnosis not present

## 2018-08-10 DIAGNOSIS — R0982 Postnasal drip: Secondary | ICD-10-CM | POA: Diagnosis not present

## 2018-08-10 DIAGNOSIS — F17210 Nicotine dependence, cigarettes, uncomplicated: Secondary | ICD-10-CM | POA: Diagnosis not present

## 2018-08-12 DIAGNOSIS — R972 Elevated prostate specific antigen [PSA]: Secondary | ICD-10-CM | POA: Diagnosis not present

## 2018-08-12 DIAGNOSIS — N401 Enlarged prostate with lower urinary tract symptoms: Secondary | ICD-10-CM | POA: Diagnosis not present

## 2018-08-20 DIAGNOSIS — Z23 Encounter for immunization: Secondary | ICD-10-CM | POA: Diagnosis not present

## 2018-09-10 DIAGNOSIS — R972 Elevated prostate specific antigen [PSA]: Secondary | ICD-10-CM | POA: Diagnosis not present

## 2018-10-15 DIAGNOSIS — E893 Postprocedural hypopituitarism: Secondary | ICD-10-CM | POA: Diagnosis not present

## 2018-10-15 DIAGNOSIS — E2749 Other adrenocortical insufficiency: Secondary | ICD-10-CM | POA: Diagnosis not present

## 2018-10-15 DIAGNOSIS — E291 Testicular hypofunction: Secondary | ICD-10-CM | POA: Diagnosis not present

## 2018-10-15 DIAGNOSIS — E038 Other specified hypothyroidism: Secondary | ICD-10-CM | POA: Diagnosis not present

## 2018-10-19 DIAGNOSIS — J019 Acute sinusitis, unspecified: Secondary | ICD-10-CM | POA: Diagnosis not present

## 2018-11-16 DIAGNOSIS — R0602 Shortness of breath: Secondary | ICD-10-CM | POA: Diagnosis not present

## 2018-11-16 DIAGNOSIS — J80 Acute respiratory distress syndrome: Secondary | ICD-10-CM | POA: Diagnosis not present

## 2018-11-16 DIAGNOSIS — J09X2 Influenza due to identified novel influenza A virus with other respiratory manifestations: Secondary | ICD-10-CM | POA: Diagnosis not present

## 2018-11-16 DIAGNOSIS — E119 Type 2 diabetes mellitus without complications: Secondary | ICD-10-CM | POA: Diagnosis not present

## 2018-11-16 DIAGNOSIS — R05 Cough: Secondary | ICD-10-CM | POA: Diagnosis not present

## 2018-11-16 DIAGNOSIS — R069 Unspecified abnormalities of breathing: Secondary | ICD-10-CM | POA: Diagnosis not present

## 2018-11-16 DIAGNOSIS — I2510 Atherosclerotic heart disease of native coronary artery without angina pectoris: Secondary | ICD-10-CM | POA: Diagnosis not present

## 2018-11-16 DIAGNOSIS — Z2821 Immunization not carried out because of patient refusal: Secondary | ICD-10-CM | POA: Diagnosis not present

## 2018-11-16 DIAGNOSIS — Z86711 Personal history of pulmonary embolism: Secondary | ICD-10-CM | POA: Diagnosis not present

## 2018-11-16 DIAGNOSIS — R0902 Hypoxemia: Secondary | ICD-10-CM | POA: Diagnosis not present

## 2018-11-16 DIAGNOSIS — E785 Hyperlipidemia, unspecified: Secondary | ICD-10-CM | POA: Diagnosis not present

## 2018-11-16 DIAGNOSIS — F17210 Nicotine dependence, cigarettes, uncomplicated: Secondary | ICD-10-CM | POA: Diagnosis not present

## 2018-11-16 DIAGNOSIS — E039 Hypothyroidism, unspecified: Secondary | ICD-10-CM | POA: Diagnosis not present

## 2018-11-16 DIAGNOSIS — Z79899 Other long term (current) drug therapy: Secondary | ICD-10-CM | POA: Diagnosis not present

## 2018-11-16 DIAGNOSIS — Z7901 Long term (current) use of anticoagulants: Secondary | ICD-10-CM | POA: Diagnosis not present

## 2018-11-16 DIAGNOSIS — N400 Enlarged prostate without lower urinary tract symptoms: Secondary | ICD-10-CM | POA: Diagnosis not present

## 2018-11-16 DIAGNOSIS — J441 Chronic obstructive pulmonary disease with (acute) exacerbation: Secondary | ICD-10-CM | POA: Diagnosis not present

## 2018-11-16 DIAGNOSIS — F17200 Nicotine dependence, unspecified, uncomplicated: Secondary | ICD-10-CM | POA: Diagnosis not present

## 2018-11-16 DIAGNOSIS — Z951 Presence of aortocoronary bypass graft: Secondary | ICD-10-CM | POA: Diagnosis not present

## 2018-11-16 DIAGNOSIS — D6861 Antiphospholipid syndrome: Secondary | ICD-10-CM | POA: Diagnosis not present

## 2018-11-16 DIAGNOSIS — R000 Tachycardia, unspecified: Secondary | ICD-10-CM | POA: Diagnosis not present

## 2018-11-16 DIAGNOSIS — I10 Essential (primary) hypertension: Secondary | ICD-10-CM | POA: Diagnosis not present

## 2018-11-16 DIAGNOSIS — J101 Influenza due to other identified influenza virus with other respiratory manifestations: Secondary | ICD-10-CM | POA: Diagnosis not present

## 2018-11-19 ENCOUNTER — Other Ambulatory Visit: Payer: Self-pay

## 2018-11-19 NOTE — Patient Outreach (Signed)
[facility]) Care Management  11/19/2018  Matthew Adams 1961/02/08 [id number]     Transition of Care Referral  Referral Date: 11/19/2018  Referral Source: [facility] Discharge Report Date of Admission: unknown Diagnosis: "flu" Date of Discharge: 11/17/2018 Facility: [place]: [facility]   Referral received. No outreach warranted at this time. TOC will be completed by primary care provider office(Dr. Kiana per [facility])  who will refer to [facility] care mgmt if needed.     Plan: RN CM will close case at this time.  [name], RN,BSN,CCM [facility] Management Telephonic Care Management Coordinator Direct Phone: [phone] Toll Free: [phone] Fax: [phone]

## 2018-11-23 DIAGNOSIS — I2782 Chronic pulmonary embolism: Secondary | ICD-10-CM | POA: Diagnosis not present

## 2018-11-23 DIAGNOSIS — R739 Hyperglycemia, unspecified: Secondary | ICD-10-CM | POA: Diagnosis not present

## 2018-11-23 DIAGNOSIS — J441 Chronic obstructive pulmonary disease with (acute) exacerbation: Secondary | ICD-10-CM | POA: Diagnosis not present

## 2018-11-23 DIAGNOSIS — E785 Hyperlipidemia, unspecified: Secondary | ICD-10-CM | POA: Diagnosis not present

## 2018-11-23 DIAGNOSIS — J101 Influenza due to other identified influenza virus with other respiratory manifestations: Secondary | ICD-10-CM | POA: Diagnosis not present

## 2018-11-23 DIAGNOSIS — Z9189 Other specified personal risk factors, not elsewhere classified: Secondary | ICD-10-CM | POA: Diagnosis not present

## 2018-12-15 DIAGNOSIS — R05 Cough: Secondary | ICD-10-CM | POA: Diagnosis not present

## 2018-12-15 DIAGNOSIS — F17210 Nicotine dependence, cigarettes, uncomplicated: Secondary | ICD-10-CM | POA: Diagnosis not present

## 2018-12-15 DIAGNOSIS — J441 Chronic obstructive pulmonary disease with (acute) exacerbation: Secondary | ICD-10-CM | POA: Diagnosis not present

## 2018-12-15 DIAGNOSIS — R918 Other nonspecific abnormal finding of lung field: Secondary | ICD-10-CM | POA: Diagnosis not present

## 2018-12-27 DIAGNOSIS — J439 Emphysema, unspecified: Secondary | ICD-10-CM | POA: Diagnosis not present

## 2018-12-27 DIAGNOSIS — R05 Cough: Secondary | ICD-10-CM | POA: Diagnosis not present

## 2018-12-28 DIAGNOSIS — J309 Allergic rhinitis, unspecified: Secondary | ICD-10-CM | POA: Diagnosis not present

## 2018-12-28 DIAGNOSIS — R911 Solitary pulmonary nodule: Secondary | ICD-10-CM | POA: Diagnosis not present

## 2018-12-28 DIAGNOSIS — R05 Cough: Secondary | ICD-10-CM | POA: Diagnosis not present

## 2018-12-31 DIAGNOSIS — R911 Solitary pulmonary nodule: Secondary | ICD-10-CM | POA: Diagnosis not present

## 2018-12-31 DIAGNOSIS — R918 Other nonspecific abnormal finding of lung field: Secondary | ICD-10-CM | POA: Diagnosis not present

## 2019-01-17 DIAGNOSIS — J449 Chronic obstructive pulmonary disease, unspecified: Secondary | ICD-10-CM | POA: Diagnosis not present

## 2019-01-17 DIAGNOSIS — E1122 Type 2 diabetes mellitus with diabetic chronic kidney disease: Secondary | ICD-10-CM | POA: Diagnosis not present

## 2019-01-17 DIAGNOSIS — Z0000 Encounter for general adult medical examination without abnormal findings: Secondary | ICD-10-CM | POA: Diagnosis not present

## 2019-01-17 DIAGNOSIS — Z8701 Personal history of pneumonia (recurrent): Secondary | ICD-10-CM | POA: Diagnosis not present

## 2019-01-17 DIAGNOSIS — Z87891 Personal history of nicotine dependence: Secondary | ICD-10-CM | POA: Diagnosis not present

## 2019-01-17 DIAGNOSIS — I130 Hypertensive heart and chronic kidney disease with heart failure and stage 1 through stage 4 chronic kidney disease, or unspecified chronic kidney disease: Secondary | ICD-10-CM | POA: Diagnosis not present

## 2019-01-17 DIAGNOSIS — Z950 Presence of cardiac pacemaker: Secondary | ICD-10-CM | POA: Diagnosis not present

## 2019-01-17 DIAGNOSIS — Z1331 Encounter for screening for depression: Secondary | ICD-10-CM | POA: Diagnosis not present

## 2019-01-17 DIAGNOSIS — E785 Hyperlipidemia, unspecified: Secondary | ICD-10-CM | POA: Diagnosis not present

## 2019-01-17 DIAGNOSIS — I272 Pulmonary hypertension, unspecified: Secondary | ICD-10-CM | POA: Diagnosis not present

## 2019-01-17 DIAGNOSIS — M170 Bilateral primary osteoarthritis of knee: Secondary | ICD-10-CM | POA: Diagnosis not present

## 2019-01-17 DIAGNOSIS — I340 Nonrheumatic mitral (valve) insufficiency: Secondary | ICD-10-CM | POA: Diagnosis not present

## 2019-01-17 DIAGNOSIS — I4819 Other persistent atrial fibrillation: Secondary | ICD-10-CM | POA: Diagnosis not present

## 2019-01-17 DIAGNOSIS — N184 Chronic kidney disease, stage 4 (severe): Secondary | ICD-10-CM | POA: Diagnosis not present

## 2019-01-17 DIAGNOSIS — Z7901 Long term (current) use of anticoagulants: Secondary | ICD-10-CM | POA: Diagnosis not present

## 2019-01-17 DIAGNOSIS — Z125 Encounter for screening for malignant neoplasm of prostate: Secondary | ICD-10-CM | POA: Diagnosis not present

## 2019-01-17 DIAGNOSIS — Z136 Encounter for screening for cardiovascular disorders: Secondary | ICD-10-CM | POA: Diagnosis not present

## 2019-01-17 DIAGNOSIS — I5020 Unspecified systolic (congestive) heart failure: Secondary | ICD-10-CM | POA: Diagnosis not present

## 2019-01-17 DIAGNOSIS — Z170 Estrogen receptor positive status [ER+]: Secondary | ICD-10-CM | POA: Diagnosis not present

## 2019-01-17 DIAGNOSIS — Z1339 Encounter for screening examination for other mental health and behavioral disorders: Secondary | ICD-10-CM | POA: Diagnosis not present

## 2019-01-17 DIAGNOSIS — D631 Anemia in chronic kidney disease: Secondary | ICD-10-CM | POA: Diagnosis not present

## 2019-01-17 DIAGNOSIS — M4856XD Collapsed vertebra, not elsewhere classified, lumbar region, subsequent encounter for fracture with routine healing: Secondary | ICD-10-CM | POA: Diagnosis not present

## 2019-01-17 DIAGNOSIS — I5032 Chronic diastolic (congestive) heart failure: Secondary | ICD-10-CM | POA: Diagnosis not present

## 2019-01-17 DIAGNOSIS — E039 Hypothyroidism, unspecified: Secondary | ICD-10-CM | POA: Diagnosis not present

## 2019-01-17 DIAGNOSIS — Z7984 Long term (current) use of oral hypoglycemic drugs: Secondary | ICD-10-CM | POA: Diagnosis not present

## 2019-01-17 DIAGNOSIS — Z9181 History of falling: Secondary | ICD-10-CM | POA: Diagnosis not present

## 2019-01-17 DIAGNOSIS — M1812 Unilateral primary osteoarthritis of first carpometacarpal joint, left hand: Secondary | ICD-10-CM | POA: Diagnosis not present

## 2019-01-17 DIAGNOSIS — C50212 Malignant neoplasm of upper-inner quadrant of left female breast: Secondary | ICD-10-CM | POA: Diagnosis not present

## 2019-01-17 DIAGNOSIS — I495 Sick sinus syndrome: Secondary | ICD-10-CM | POA: Diagnosis not present

## 2019-02-14 DIAGNOSIS — Z951 Presence of aortocoronary bypass graft: Secondary | ICD-10-CM | POA: Diagnosis not present

## 2019-02-14 DIAGNOSIS — Z86711 Personal history of pulmonary embolism: Secondary | ICD-10-CM | POA: Diagnosis not present

## 2019-02-14 DIAGNOSIS — N529 Male erectile dysfunction, unspecified: Secondary | ICD-10-CM | POA: Insufficient documentation

## 2019-02-14 DIAGNOSIS — Z955 Presence of coronary angioplasty implant and graft: Secondary | ICD-10-CM | POA: Diagnosis not present

## 2019-02-14 DIAGNOSIS — I2510 Atherosclerotic heart disease of native coronary artery without angina pectoris: Secondary | ICD-10-CM | POA: Diagnosis not present

## 2019-02-14 DIAGNOSIS — E782 Mixed hyperlipidemia: Secondary | ICD-10-CM | POA: Diagnosis not present

## 2019-02-14 DIAGNOSIS — Z7901 Long term (current) use of anticoagulants: Secondary | ICD-10-CM | POA: Diagnosis not present

## 2019-02-14 HISTORY — DX: Male erectile dysfunction, unspecified: N52.9

## 2019-03-16 DIAGNOSIS — F17210 Nicotine dependence, cigarettes, uncomplicated: Secondary | ICD-10-CM | POA: Diagnosis not present

## 2019-03-16 DIAGNOSIS — J449 Chronic obstructive pulmonary disease, unspecified: Secondary | ICD-10-CM | POA: Diagnosis not present

## 2019-03-16 DIAGNOSIS — R05 Cough: Secondary | ICD-10-CM | POA: Diagnosis not present

## 2019-03-16 DIAGNOSIS — R918 Other nonspecific abnormal finding of lung field: Secondary | ICD-10-CM | POA: Diagnosis not present

## 2019-03-16 DIAGNOSIS — K219 Gastro-esophageal reflux disease without esophagitis: Secondary | ICD-10-CM | POA: Diagnosis not present

## 2019-03-24 DIAGNOSIS — E559 Vitamin D deficiency, unspecified: Secondary | ICD-10-CM | POA: Diagnosis not present

## 2019-03-24 DIAGNOSIS — I2782 Chronic pulmonary embolism: Secondary | ICD-10-CM | POA: Diagnosis not present

## 2019-03-24 DIAGNOSIS — E785 Hyperlipidemia, unspecified: Secondary | ICD-10-CM | POA: Diagnosis not present

## 2019-03-24 DIAGNOSIS — E237 Disorder of pituitary gland, unspecified: Secondary | ICD-10-CM | POA: Diagnosis not present

## 2019-03-24 DIAGNOSIS — R739 Hyperglycemia, unspecified: Secondary | ICD-10-CM | POA: Diagnosis not present

## 2019-03-24 DIAGNOSIS — I119 Hypertensive heart disease without heart failure: Secondary | ICD-10-CM | POA: Diagnosis not present

## 2019-03-24 DIAGNOSIS — E039 Hypothyroidism, unspecified: Secondary | ICD-10-CM | POA: Diagnosis not present

## 2019-03-24 DIAGNOSIS — E291 Testicular hypofunction: Secondary | ICD-10-CM | POA: Diagnosis not present

## 2019-03-29 DIAGNOSIS — E785 Hyperlipidemia, unspecified: Secondary | ICD-10-CM | POA: Diagnosis not present

## 2019-03-29 DIAGNOSIS — R739 Hyperglycemia, unspecified: Secondary | ICD-10-CM | POA: Diagnosis not present

## 2019-03-29 DIAGNOSIS — I119 Hypertensive heart disease without heart failure: Secondary | ICD-10-CM | POA: Diagnosis not present

## 2019-03-29 DIAGNOSIS — E039 Hypothyroidism, unspecified: Secondary | ICD-10-CM | POA: Diagnosis not present

## 2019-03-29 DIAGNOSIS — E291 Testicular hypofunction: Secondary | ICD-10-CM | POA: Diagnosis not present

## 2019-04-11 DIAGNOSIS — K5730 Diverticulosis of large intestine without perforation or abscess without bleeding: Secondary | ICD-10-CM | POA: Diagnosis not present

## 2019-04-11 DIAGNOSIS — K2270 Barrett's esophagus without dysplasia: Secondary | ICD-10-CM | POA: Diagnosis not present

## 2019-04-11 DIAGNOSIS — K219 Gastro-esophageal reflux disease without esophagitis: Secondary | ICD-10-CM | POA: Diagnosis not present

## 2019-04-23 DIAGNOSIS — E038 Other specified hypothyroidism: Secondary | ICD-10-CM | POA: Diagnosis not present

## 2019-04-23 DIAGNOSIS — E2749 Other adrenocortical insufficiency: Secondary | ICD-10-CM | POA: Diagnosis not present

## 2019-04-23 DIAGNOSIS — E893 Postprocedural hypopituitarism: Secondary | ICD-10-CM | POA: Diagnosis not present

## 2019-04-23 DIAGNOSIS — E291 Testicular hypofunction: Secondary | ICD-10-CM | POA: Diagnosis not present

## 2019-05-03 DIAGNOSIS — E038 Other specified hypothyroidism: Secondary | ICD-10-CM | POA: Diagnosis not present

## 2019-05-03 DIAGNOSIS — E291 Testicular hypofunction: Secondary | ICD-10-CM | POA: Diagnosis not present

## 2019-05-23 DIAGNOSIS — E291 Testicular hypofunction: Secondary | ICD-10-CM | POA: Diagnosis not present

## 2019-05-23 DIAGNOSIS — R972 Elevated prostate specific antigen [PSA]: Secondary | ICD-10-CM | POA: Diagnosis not present

## 2019-05-23 DIAGNOSIS — N401 Enlarged prostate with lower urinary tract symptoms: Secondary | ICD-10-CM | POA: Diagnosis not present

## 2019-06-15 DIAGNOSIS — R05 Cough: Secondary | ICD-10-CM | POA: Diagnosis not present

## 2019-06-15 DIAGNOSIS — J449 Chronic obstructive pulmonary disease, unspecified: Secondary | ICD-10-CM | POA: Diagnosis not present

## 2019-06-15 DIAGNOSIS — K219 Gastro-esophageal reflux disease without esophagitis: Secondary | ICD-10-CM | POA: Diagnosis not present

## 2019-06-15 DIAGNOSIS — Z87891 Personal history of nicotine dependence: Secondary | ICD-10-CM | POA: Diagnosis not present

## 2019-06-15 DIAGNOSIS — R918 Other nonspecific abnormal finding of lung field: Secondary | ICD-10-CM | POA: Diagnosis not present

## 2019-07-12 DIAGNOSIS — Z01818 Encounter for other preprocedural examination: Secondary | ICD-10-CM | POA: Diagnosis not present

## 2019-07-12 DIAGNOSIS — H25811 Combined forms of age-related cataract, right eye: Secondary | ICD-10-CM | POA: Diagnosis not present

## 2019-07-12 DIAGNOSIS — H2511 Age-related nuclear cataract, right eye: Secondary | ICD-10-CM | POA: Diagnosis not present

## 2019-07-26 DIAGNOSIS — H259 Unspecified age-related cataract: Secondary | ICD-10-CM | POA: Diagnosis not present

## 2019-07-26 DIAGNOSIS — Z955 Presence of coronary angioplasty implant and graft: Secondary | ICD-10-CM | POA: Diagnosis not present

## 2019-07-26 DIAGNOSIS — K219 Gastro-esophageal reflux disease without esophagitis: Secondary | ICD-10-CM | POA: Diagnosis not present

## 2019-07-26 DIAGNOSIS — Z7952 Long term (current) use of systemic steroids: Secondary | ICD-10-CM | POA: Diagnosis not present

## 2019-07-26 DIAGNOSIS — E785 Hyperlipidemia, unspecified: Secondary | ICD-10-CM | POA: Diagnosis not present

## 2019-07-26 DIAGNOSIS — Z7901 Long term (current) use of anticoagulants: Secondary | ICD-10-CM | POA: Diagnosis not present

## 2019-07-26 DIAGNOSIS — E1136 Type 2 diabetes mellitus with diabetic cataract: Secondary | ICD-10-CM | POA: Diagnosis not present

## 2019-07-26 DIAGNOSIS — E039 Hypothyroidism, unspecified: Secondary | ICD-10-CM | POA: Diagnosis not present

## 2019-07-26 DIAGNOSIS — Z79899 Other long term (current) drug therapy: Secondary | ICD-10-CM | POA: Diagnosis not present

## 2019-07-26 DIAGNOSIS — J449 Chronic obstructive pulmonary disease, unspecified: Secondary | ICD-10-CM | POA: Diagnosis not present

## 2019-07-26 DIAGNOSIS — H25811 Combined forms of age-related cataract, right eye: Secondary | ICD-10-CM | POA: Diagnosis not present

## 2019-07-26 DIAGNOSIS — Z7982 Long term (current) use of aspirin: Secondary | ICD-10-CM | POA: Diagnosis not present

## 2019-07-26 DIAGNOSIS — Z951 Presence of aortocoronary bypass graft: Secondary | ICD-10-CM | POA: Diagnosis not present

## 2019-07-26 DIAGNOSIS — Z87891 Personal history of nicotine dependence: Secondary | ICD-10-CM | POA: Diagnosis not present

## 2019-08-03 DIAGNOSIS — E559 Vitamin D deficiency, unspecified: Secondary | ICD-10-CM | POA: Diagnosis not present

## 2019-08-03 DIAGNOSIS — Z720 Tobacco use: Secondary | ICD-10-CM | POA: Diagnosis not present

## 2019-08-03 DIAGNOSIS — J441 Chronic obstructive pulmonary disease with (acute) exacerbation: Secondary | ICD-10-CM | POA: Diagnosis not present

## 2019-08-03 DIAGNOSIS — E785 Hyperlipidemia, unspecified: Secondary | ICD-10-CM | POA: Diagnosis not present

## 2019-08-03 DIAGNOSIS — I119 Hypertensive heart disease without heart failure: Secondary | ICD-10-CM | POA: Diagnosis not present

## 2019-08-03 DIAGNOSIS — I2782 Chronic pulmonary embolism: Secondary | ICD-10-CM | POA: Diagnosis not present

## 2019-08-03 DIAGNOSIS — E291 Testicular hypofunction: Secondary | ICD-10-CM | POA: Diagnosis not present

## 2019-08-03 DIAGNOSIS — Z23 Encounter for immunization: Secondary | ICD-10-CM | POA: Diagnosis not present

## 2019-08-03 DIAGNOSIS — R739 Hyperglycemia, unspecified: Secondary | ICD-10-CM | POA: Diagnosis not present

## 2019-08-03 DIAGNOSIS — E039 Hypothyroidism, unspecified: Secondary | ICD-10-CM | POA: Diagnosis not present

## 2019-08-03 DIAGNOSIS — E237 Disorder of pituitary gland, unspecified: Secondary | ICD-10-CM | POA: Diagnosis not present

## 2019-08-03 DIAGNOSIS — Z6827 Body mass index (BMI) 27.0-27.9, adult: Secondary | ICD-10-CM | POA: Diagnosis not present

## 2019-08-23 DIAGNOSIS — Z79899 Other long term (current) drug therapy: Secondary | ICD-10-CM | POA: Diagnosis not present

## 2019-08-23 DIAGNOSIS — G4730 Sleep apnea, unspecified: Secondary | ICD-10-CM | POA: Diagnosis not present

## 2019-08-23 DIAGNOSIS — E785 Hyperlipidemia, unspecified: Secondary | ICD-10-CM | POA: Diagnosis not present

## 2019-08-23 DIAGNOSIS — I2510 Atherosclerotic heart disease of native coronary artery without angina pectoris: Secondary | ICD-10-CM | POA: Diagnosis not present

## 2019-08-23 DIAGNOSIS — Z7982 Long term (current) use of aspirin: Secondary | ICD-10-CM | POA: Diagnosis not present

## 2019-08-23 DIAGNOSIS — K219 Gastro-esophageal reflux disease without esophagitis: Secondary | ICD-10-CM | POA: Diagnosis not present

## 2019-08-23 DIAGNOSIS — H259 Unspecified age-related cataract: Secondary | ICD-10-CM | POA: Diagnosis not present

## 2019-08-23 DIAGNOSIS — H25812 Combined forms of age-related cataract, left eye: Secondary | ICD-10-CM | POA: Diagnosis not present

## 2019-08-23 DIAGNOSIS — Z9989 Dependence on other enabling machines and devices: Secondary | ICD-10-CM | POA: Diagnosis not present

## 2019-08-23 DIAGNOSIS — G629 Polyneuropathy, unspecified: Secondary | ICD-10-CM | POA: Diagnosis not present

## 2019-08-23 DIAGNOSIS — E1136 Type 2 diabetes mellitus with diabetic cataract: Secondary | ICD-10-CM | POA: Diagnosis not present

## 2019-08-23 DIAGNOSIS — Z955 Presence of coronary angioplasty implant and graft: Secondary | ICD-10-CM | POA: Diagnosis not present

## 2019-08-23 DIAGNOSIS — I10 Essential (primary) hypertension: Secondary | ICD-10-CM | POA: Diagnosis not present

## 2019-08-23 DIAGNOSIS — E039 Hypothyroidism, unspecified: Secondary | ICD-10-CM | POA: Diagnosis not present

## 2019-08-23 DIAGNOSIS — Z951 Presence of aortocoronary bypass graft: Secondary | ICD-10-CM | POA: Diagnosis not present

## 2019-08-23 DIAGNOSIS — J449 Chronic obstructive pulmonary disease, unspecified: Secondary | ICD-10-CM | POA: Diagnosis not present

## 2019-08-23 DIAGNOSIS — Z87891 Personal history of nicotine dependence: Secondary | ICD-10-CM | POA: Diagnosis not present

## 2019-08-23 DIAGNOSIS — Z7901 Long term (current) use of anticoagulants: Secondary | ICD-10-CM | POA: Diagnosis not present

## 2019-08-29 DIAGNOSIS — Z955 Presence of coronary angioplasty implant and graft: Secondary | ICD-10-CM | POA: Diagnosis not present

## 2019-08-29 DIAGNOSIS — Z7901 Long term (current) use of anticoagulants: Secondary | ICD-10-CM | POA: Diagnosis not present

## 2019-08-29 DIAGNOSIS — Z951 Presence of aortocoronary bypass graft: Secondary | ICD-10-CM | POA: Diagnosis not present

## 2019-08-29 DIAGNOSIS — Z86711 Personal history of pulmonary embolism: Secondary | ICD-10-CM | POA: Diagnosis not present

## 2019-08-29 DIAGNOSIS — E782 Mixed hyperlipidemia: Secondary | ICD-10-CM | POA: Diagnosis not present

## 2019-08-29 DIAGNOSIS — I2510 Atherosclerotic heart disease of native coronary artery without angina pectoris: Secondary | ICD-10-CM | POA: Diagnosis not present

## 2019-09-07 DIAGNOSIS — K219 Gastro-esophageal reflux disease without esophagitis: Secondary | ICD-10-CM | POA: Diagnosis not present

## 2019-09-07 DIAGNOSIS — J449 Chronic obstructive pulmonary disease, unspecified: Secondary | ICD-10-CM | POA: Diagnosis not present

## 2019-09-07 DIAGNOSIS — R05 Cough: Secondary | ICD-10-CM | POA: Diagnosis not present

## 2019-09-07 DIAGNOSIS — R918 Other nonspecific abnormal finding of lung field: Secondary | ICD-10-CM | POA: Diagnosis not present

## 2019-10-11 DIAGNOSIS — R05 Cough: Secondary | ICD-10-CM | POA: Diagnosis not present

## 2019-10-11 DIAGNOSIS — R918 Other nonspecific abnormal finding of lung field: Secondary | ICD-10-CM | POA: Diagnosis not present

## 2019-10-11 DIAGNOSIS — J449 Chronic obstructive pulmonary disease, unspecified: Secondary | ICD-10-CM | POA: Diagnosis not present

## 2019-10-11 DIAGNOSIS — K219 Gastro-esophageal reflux disease without esophagitis: Secondary | ICD-10-CM | POA: Diagnosis not present

## 2019-10-24 DIAGNOSIS — E039 Hypothyroidism, unspecified: Secondary | ICD-10-CM | POA: Diagnosis not present

## 2019-10-24 DIAGNOSIS — E038 Other specified hypothyroidism: Secondary | ICD-10-CM | POA: Diagnosis not present

## 2019-10-24 DIAGNOSIS — E2740 Unspecified adrenocortical insufficiency: Secondary | ICD-10-CM | POA: Diagnosis not present

## 2019-10-24 DIAGNOSIS — E2749 Other adrenocortical insufficiency: Secondary | ICD-10-CM | POA: Diagnosis not present

## 2019-10-24 DIAGNOSIS — E291 Testicular hypofunction: Secondary | ICD-10-CM | POA: Diagnosis not present

## 2019-10-24 DIAGNOSIS — E893 Postprocedural hypopituitarism: Secondary | ICD-10-CM | POA: Diagnosis not present

## 2019-11-30 DIAGNOSIS — G4733 Obstructive sleep apnea (adult) (pediatric): Secondary | ICD-10-CM | POA: Diagnosis not present

## 2019-11-30 DIAGNOSIS — J449 Chronic obstructive pulmonary disease, unspecified: Secondary | ICD-10-CM | POA: Diagnosis not present

## 2019-11-30 DIAGNOSIS — R918 Other nonspecific abnormal finding of lung field: Secondary | ICD-10-CM | POA: Diagnosis not present

## 2019-11-30 DIAGNOSIS — K219 Gastro-esophageal reflux disease without esophagitis: Secondary | ICD-10-CM | POA: Diagnosis not present

## 2019-12-05 DIAGNOSIS — R972 Elevated prostate specific antigen [PSA]: Secondary | ICD-10-CM | POA: Diagnosis not present

## 2019-12-05 DIAGNOSIS — E291 Testicular hypofunction: Secondary | ICD-10-CM | POA: Diagnosis not present

## 2019-12-05 DIAGNOSIS — N401 Enlarged prostate with lower urinary tract symptoms: Secondary | ICD-10-CM | POA: Diagnosis not present

## 2019-12-08 DIAGNOSIS — R6889 Other general symptoms and signs: Secondary | ICD-10-CM | POA: Diagnosis not present

## 2019-12-08 DIAGNOSIS — Z20822 Contact with and (suspected) exposure to covid-19: Secondary | ICD-10-CM | POA: Diagnosis not present

## 2019-12-08 DIAGNOSIS — J441 Chronic obstructive pulmonary disease with (acute) exacerbation: Secondary | ICD-10-CM | POA: Diagnosis not present

## 2019-12-16 DIAGNOSIS — Z720 Tobacco use: Secondary | ICD-10-CM | POA: Diagnosis not present

## 2019-12-16 DIAGNOSIS — E559 Vitamin D deficiency, unspecified: Secondary | ICD-10-CM | POA: Diagnosis not present

## 2019-12-16 DIAGNOSIS — Z6827 Body mass index (BMI) 27.0-27.9, adult: Secondary | ICD-10-CM | POA: Diagnosis not present

## 2019-12-16 DIAGNOSIS — E291 Testicular hypofunction: Secondary | ICD-10-CM | POA: Diagnosis not present

## 2019-12-16 DIAGNOSIS — E237 Disorder of pituitary gland, unspecified: Secondary | ICD-10-CM | POA: Diagnosis not present

## 2019-12-16 DIAGNOSIS — I119 Hypertensive heart disease without heart failure: Secondary | ICD-10-CM | POA: Diagnosis not present

## 2019-12-16 DIAGNOSIS — E785 Hyperlipidemia, unspecified: Secondary | ICD-10-CM | POA: Diagnosis not present

## 2019-12-16 DIAGNOSIS — I2782 Chronic pulmonary embolism: Secondary | ICD-10-CM | POA: Diagnosis not present

## 2019-12-16 DIAGNOSIS — E039 Hypothyroidism, unspecified: Secondary | ICD-10-CM | POA: Diagnosis not present

## 2019-12-16 DIAGNOSIS — R739 Hyperglycemia, unspecified: Secondary | ICD-10-CM | POA: Diagnosis not present

## 2019-12-16 DIAGNOSIS — J181 Lobar pneumonia, unspecified organism: Secondary | ICD-10-CM | POA: Diagnosis not present

## 2020-02-17 DIAGNOSIS — G4733 Obstructive sleep apnea (adult) (pediatric): Secondary | ICD-10-CM | POA: Diagnosis not present

## 2020-02-17 DIAGNOSIS — K219 Gastro-esophageal reflux disease without esophagitis: Secondary | ICD-10-CM | POA: Diagnosis not present

## 2020-02-17 DIAGNOSIS — J449 Chronic obstructive pulmonary disease, unspecified: Secondary | ICD-10-CM | POA: Diagnosis not present

## 2020-02-17 DIAGNOSIS — R918 Other nonspecific abnormal finding of lung field: Secondary | ICD-10-CM | POA: Diagnosis not present

## 2020-02-27 DIAGNOSIS — I2510 Atherosclerotic heart disease of native coronary artery without angina pectoris: Secondary | ICD-10-CM | POA: Diagnosis not present

## 2020-02-27 DIAGNOSIS — Z955 Presence of coronary angioplasty implant and graft: Secondary | ICD-10-CM | POA: Diagnosis not present

## 2020-02-27 DIAGNOSIS — Z951 Presence of aortocoronary bypass graft: Secondary | ICD-10-CM | POA: Diagnosis not present

## 2020-02-27 DIAGNOSIS — E782 Mixed hyperlipidemia: Secondary | ICD-10-CM | POA: Diagnosis not present

## 2020-02-28 DIAGNOSIS — Z125 Encounter for screening for malignant neoplasm of prostate: Secondary | ICD-10-CM | POA: Diagnosis not present

## 2020-02-28 DIAGNOSIS — Z9181 History of falling: Secondary | ICD-10-CM | POA: Diagnosis not present

## 2020-02-28 DIAGNOSIS — E785 Hyperlipidemia, unspecified: Secondary | ICD-10-CM | POA: Diagnosis not present

## 2020-02-28 DIAGNOSIS — Z0000 Encounter for general adult medical examination without abnormal findings: Secondary | ICD-10-CM | POA: Diagnosis not present

## 2020-02-28 DIAGNOSIS — Z1331 Encounter for screening for depression: Secondary | ICD-10-CM | POA: Diagnosis not present

## 2020-04-18 DIAGNOSIS — Z720 Tobacco use: Secondary | ICD-10-CM | POA: Diagnosis not present

## 2020-04-18 DIAGNOSIS — I119 Hypertensive heart disease without heart failure: Secondary | ICD-10-CM | POA: Diagnosis not present

## 2020-04-18 DIAGNOSIS — M25551 Pain in right hip: Secondary | ICD-10-CM | POA: Diagnosis not present

## 2020-04-18 DIAGNOSIS — E237 Disorder of pituitary gland, unspecified: Secondary | ICD-10-CM | POA: Diagnosis not present

## 2020-04-18 DIAGNOSIS — R739 Hyperglycemia, unspecified: Secondary | ICD-10-CM | POA: Diagnosis not present

## 2020-04-18 DIAGNOSIS — E785 Hyperlipidemia, unspecified: Secondary | ICD-10-CM | POA: Diagnosis not present

## 2020-04-18 DIAGNOSIS — E291 Testicular hypofunction: Secondary | ICD-10-CM | POA: Diagnosis not present

## 2020-04-18 DIAGNOSIS — H6090 Unspecified otitis externa, unspecified ear: Secondary | ICD-10-CM | POA: Diagnosis not present

## 2020-04-18 DIAGNOSIS — Z6828 Body mass index (BMI) 28.0-28.9, adult: Secondary | ICD-10-CM | POA: Diagnosis not present

## 2020-04-18 DIAGNOSIS — E039 Hypothyroidism, unspecified: Secondary | ICD-10-CM | POA: Diagnosis not present

## 2020-04-18 DIAGNOSIS — E559 Vitamin D deficiency, unspecified: Secondary | ICD-10-CM | POA: Diagnosis not present

## 2020-04-18 DIAGNOSIS — Z86711 Personal history of pulmonary embolism: Secondary | ICD-10-CM | POA: Diagnosis not present

## 2020-04-23 DIAGNOSIS — E2749 Other adrenocortical insufficiency: Secondary | ICD-10-CM | POA: Diagnosis not present

## 2020-04-23 DIAGNOSIS — E038 Other specified hypothyroidism: Secondary | ICD-10-CM | POA: Diagnosis not present

## 2020-04-23 DIAGNOSIS — E893 Postprocedural hypopituitarism: Secondary | ICD-10-CM | POA: Diagnosis not present

## 2020-04-23 DIAGNOSIS — E291 Testicular hypofunction: Secondary | ICD-10-CM | POA: Diagnosis not present

## 2020-04-23 DIAGNOSIS — Z125 Encounter for screening for malignant neoplasm of prostate: Secondary | ICD-10-CM | POA: Diagnosis not present

## 2020-04-25 DIAGNOSIS — L989 Disorder of the skin and subcutaneous tissue, unspecified: Secondary | ICD-10-CM | POA: Diagnosis not present

## 2020-05-03 DIAGNOSIS — K219 Gastro-esophageal reflux disease without esophagitis: Secondary | ICD-10-CM | POA: Diagnosis not present

## 2020-05-03 DIAGNOSIS — K2270 Barrett's esophagus without dysplasia: Secondary | ICD-10-CM | POA: Diagnosis not present

## 2020-05-03 DIAGNOSIS — K5730 Diverticulosis of large intestine without perforation or abscess without bleeding: Secondary | ICD-10-CM | POA: Diagnosis not present

## 2020-05-15 DIAGNOSIS — D485 Neoplasm of uncertain behavior of skin: Secondary | ICD-10-CM | POA: Diagnosis not present

## 2020-05-18 DIAGNOSIS — J449 Chronic obstructive pulmonary disease, unspecified: Secondary | ICD-10-CM | POA: Diagnosis not present

## 2020-05-18 DIAGNOSIS — G4733 Obstructive sleep apnea (adult) (pediatric): Secondary | ICD-10-CM | POA: Diagnosis not present

## 2020-05-18 DIAGNOSIS — K219 Gastro-esophageal reflux disease without esophagitis: Secondary | ICD-10-CM | POA: Diagnosis not present

## 2020-05-18 DIAGNOSIS — R918 Other nonspecific abnormal finding of lung field: Secondary | ICD-10-CM | POA: Diagnosis not present

## 2020-05-25 DIAGNOSIS — K449 Diaphragmatic hernia without obstruction or gangrene: Secondary | ICD-10-CM | POA: Diagnosis not present

## 2020-05-25 DIAGNOSIS — I2510 Atherosclerotic heart disease of native coronary artery without angina pectoris: Secondary | ICD-10-CM | POA: Diagnosis not present

## 2020-05-25 DIAGNOSIS — J439 Emphysema, unspecified: Secondary | ICD-10-CM | POA: Diagnosis not present

## 2020-05-25 DIAGNOSIS — J432 Centrilobular emphysema: Secondary | ICD-10-CM | POA: Diagnosis not present

## 2020-05-25 DIAGNOSIS — I700 Atherosclerosis of aorta: Secondary | ICD-10-CM | POA: Diagnosis not present

## 2020-05-25 DIAGNOSIS — J8410 Pulmonary fibrosis, unspecified: Secondary | ICD-10-CM | POA: Diagnosis not present

## 2020-05-25 DIAGNOSIS — R918 Other nonspecific abnormal finding of lung field: Secondary | ICD-10-CM | POA: Diagnosis not present

## 2020-05-25 DIAGNOSIS — K8080 Other cholelithiasis without obstruction: Secondary | ICD-10-CM | POA: Diagnosis not present

## 2020-06-01 DIAGNOSIS — K219 Gastro-esophageal reflux disease without esophagitis: Secondary | ICD-10-CM | POA: Diagnosis not present

## 2020-06-01 DIAGNOSIS — R918 Other nonspecific abnormal finding of lung field: Secondary | ICD-10-CM | POA: Diagnosis not present

## 2020-06-01 DIAGNOSIS — G4733 Obstructive sleep apnea (adult) (pediatric): Secondary | ICD-10-CM | POA: Diagnosis not present

## 2020-06-01 DIAGNOSIS — J449 Chronic obstructive pulmonary disease, unspecified: Secondary | ICD-10-CM | POA: Diagnosis not present

## 2020-07-03 DIAGNOSIS — G4733 Obstructive sleep apnea (adult) (pediatric): Secondary | ICD-10-CM | POA: Diagnosis not present

## 2020-07-03 DIAGNOSIS — K219 Gastro-esophageal reflux disease without esophagitis: Secondary | ICD-10-CM | POA: Diagnosis not present

## 2020-07-03 DIAGNOSIS — R918 Other nonspecific abnormal finding of lung field: Secondary | ICD-10-CM | POA: Diagnosis not present

## 2020-07-03 DIAGNOSIS — J449 Chronic obstructive pulmonary disease, unspecified: Secondary | ICD-10-CM | POA: Diagnosis not present

## 2020-07-13 DIAGNOSIS — J449 Chronic obstructive pulmonary disease, unspecified: Secondary | ICD-10-CM | POA: Diagnosis not present

## 2020-07-13 DIAGNOSIS — K219 Gastro-esophageal reflux disease without esophagitis: Secondary | ICD-10-CM | POA: Diagnosis not present

## 2020-07-13 DIAGNOSIS — J189 Pneumonia, unspecified organism: Secondary | ICD-10-CM | POA: Insufficient documentation

## 2020-07-13 DIAGNOSIS — G4733 Obstructive sleep apnea (adult) (pediatric): Secondary | ICD-10-CM | POA: Diagnosis not present

## 2020-07-13 DIAGNOSIS — R918 Other nonspecific abnormal finding of lung field: Secondary | ICD-10-CM | POA: Diagnosis not present

## 2020-07-13 HISTORY — DX: Pneumonia, unspecified organism: J18.9

## 2020-07-16 DIAGNOSIS — Z23 Encounter for immunization: Secondary | ICD-10-CM | POA: Diagnosis not present

## 2020-07-16 DIAGNOSIS — R200 Anesthesia of skin: Secondary | ICD-10-CM | POA: Diagnosis not present

## 2020-07-16 DIAGNOSIS — M25551 Pain in right hip: Secondary | ICD-10-CM | POA: Diagnosis not present

## 2020-07-16 DIAGNOSIS — M87059 Idiopathic aseptic necrosis of unspecified femur: Secondary | ICD-10-CM | POA: Diagnosis not present

## 2020-07-16 DIAGNOSIS — M1611 Unilateral primary osteoarthritis, right hip: Secondary | ICD-10-CM | POA: Diagnosis not present

## 2020-07-16 DIAGNOSIS — M79604 Pain in right leg: Secondary | ICD-10-CM | POA: Diagnosis not present

## 2020-07-21 DIAGNOSIS — J189 Pneumonia, unspecified organism: Secondary | ICD-10-CM | POA: Diagnosis not present

## 2020-07-27 DIAGNOSIS — M25551 Pain in right hip: Secondary | ICD-10-CM | POA: Insufficient documentation

## 2020-07-31 DIAGNOSIS — M79604 Pain in right leg: Secondary | ICD-10-CM | POA: Diagnosis not present

## 2020-07-31 DIAGNOSIS — M87851 Other osteonecrosis, right femur: Secondary | ICD-10-CM | POA: Diagnosis not present

## 2020-07-31 DIAGNOSIS — M25551 Pain in right hip: Secondary | ICD-10-CM | POA: Diagnosis not present

## 2020-07-31 DIAGNOSIS — M79661 Pain in right lower leg: Secondary | ICD-10-CM | POA: Diagnosis not present

## 2020-07-31 DIAGNOSIS — M79605 Pain in left leg: Secondary | ICD-10-CM | POA: Diagnosis not present

## 2020-07-31 DIAGNOSIS — M79662 Pain in left lower leg: Secondary | ICD-10-CM | POA: Diagnosis not present

## 2020-08-09 DIAGNOSIS — R739 Hyperglycemia, unspecified: Secondary | ICD-10-CM | POA: Diagnosis not present

## 2020-08-09 DIAGNOSIS — J441 Chronic obstructive pulmonary disease with (acute) exacerbation: Secondary | ICD-10-CM | POA: Diagnosis not present

## 2020-08-09 DIAGNOSIS — Z87891 Personal history of nicotine dependence: Secondary | ICD-10-CM | POA: Diagnosis not present

## 2020-08-09 DIAGNOSIS — R9431 Abnormal electrocardiogram [ECG] [EKG]: Secondary | ICD-10-CM

## 2020-08-09 DIAGNOSIS — I2510 Atherosclerotic heart disease of native coronary artery without angina pectoris: Secondary | ICD-10-CM | POA: Diagnosis not present

## 2020-08-09 DIAGNOSIS — Z8701 Personal history of pneumonia (recurrent): Secondary | ICD-10-CM | POA: Diagnosis not present

## 2020-08-09 DIAGNOSIS — Z86711 Personal history of pulmonary embolism: Secondary | ICD-10-CM | POA: Diagnosis not present

## 2020-08-09 DIAGNOSIS — M87059 Idiopathic aseptic necrosis of unspecified femur: Secondary | ICD-10-CM | POA: Diagnosis not present

## 2020-08-09 DIAGNOSIS — Z6828 Body mass index (BMI) 28.0-28.9, adult: Secondary | ICD-10-CM | POA: Diagnosis not present

## 2020-08-09 DIAGNOSIS — E237 Disorder of pituitary gland, unspecified: Secondary | ICD-10-CM | POA: Diagnosis not present

## 2020-08-09 DIAGNOSIS — E785 Hyperlipidemia, unspecified: Secondary | ICD-10-CM | POA: Diagnosis not present

## 2020-08-09 DIAGNOSIS — Z01818 Encounter for other preprocedural examination: Secondary | ICD-10-CM | POA: Diagnosis not present

## 2020-08-09 HISTORY — DX: Abnormal electrocardiogram (ECG) (EKG): R94.31

## 2020-08-13 DIAGNOSIS — Z01810 Encounter for preprocedural cardiovascular examination: Secondary | ICD-10-CM

## 2020-08-13 HISTORY — DX: Encounter for preprocedural cardiovascular examination: Z01.810

## 2020-08-14 DIAGNOSIS — I440 Atrioventricular block, first degree: Secondary | ICD-10-CM | POA: Diagnosis not present

## 2020-08-14 DIAGNOSIS — Z01810 Encounter for preprocedural cardiovascular examination: Secondary | ICD-10-CM | POA: Diagnosis not present

## 2020-08-14 DIAGNOSIS — Z955 Presence of coronary angioplasty implant and graft: Secondary | ICD-10-CM | POA: Diagnosis not present

## 2020-08-14 DIAGNOSIS — I493 Ventricular premature depolarization: Secondary | ICD-10-CM | POA: Diagnosis not present

## 2020-08-14 DIAGNOSIS — Z951 Presence of aortocoronary bypass graft: Secondary | ICD-10-CM | POA: Diagnosis not present

## 2020-08-14 DIAGNOSIS — I2510 Atherosclerotic heart disease of native coronary artery without angina pectoris: Secondary | ICD-10-CM | POA: Diagnosis not present

## 2020-08-14 DIAGNOSIS — R9431 Abnormal electrocardiogram [ECG] [EKG]: Secondary | ICD-10-CM | POA: Diagnosis not present

## 2020-08-15 DIAGNOSIS — Z8701 Personal history of pneumonia (recurrent): Secondary | ICD-10-CM | POA: Diagnosis not present

## 2020-08-15 DIAGNOSIS — J439 Emphysema, unspecified: Secondary | ICD-10-CM | POA: Diagnosis not present

## 2020-08-15 DIAGNOSIS — Z01818 Encounter for other preprocedural examination: Secondary | ICD-10-CM | POA: Diagnosis not present

## 2020-08-16 DIAGNOSIS — E291 Testicular hypofunction: Secondary | ICD-10-CM | POA: Diagnosis not present

## 2020-08-16 DIAGNOSIS — N401 Enlarged prostate with lower urinary tract symptoms: Secondary | ICD-10-CM | POA: Diagnosis not present

## 2020-08-16 DIAGNOSIS — R972 Elevated prostate specific antigen [PSA]: Secondary | ICD-10-CM | POA: Diagnosis not present

## 2020-09-03 ENCOUNTER — Ambulatory Visit: Payer: Self-pay | Admitting: Student

## 2020-09-10 ENCOUNTER — Ambulatory Visit: Payer: Self-pay | Admitting: Student

## 2020-09-10 NOTE — H&P (View-Only) (Signed)
TOTAL HIP ADMISSION H&P  Patient is admitted for right total hip arthroplasty.  Subjective:  Chief Complaint: right hip pain  HPI: Matthew Adams, 59 y.o. male, has a history of pain and functional disability in the right hip(s) due to arthritis and patient has failed non-surgical conservative treatments for greater than 12 weeks to include NSAID's and/or analgesics and activity modification.  Onset of symptoms was gradual starting 2 years ago with rapidlly worsening course since that time.The patient noted no past surgery on the right hip(s).  Patient currently rates pain in the right hip at 8 out of 10 with activity. Patient has worsening of pain with activity and weight bearing, pain that interfers with activities of daily living and pain with passive range of motion. Patient has evidence of subchondral sclerosis and joint space narrowing by imaging studies. This condition presents safety issues increasing the risk of falls.  There is no current active infection.  Patient Active Problem List   Diagnosis Date Noted  . History of pulmonary embolism 12/30/2011  . COPD, severe 12/30/2011  . Pulmonary cavitary lesion 12/30/2011  . Heart disease   . Hypertension   . Sleep apnea   . Hypercholesterolemia   . Diabetes mellitus    Past Medical History:  Diagnosis Date  . 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 COPD (chronic obstructive pulmonary disease)   . Diabetes mellitus   . Heart disease    Coronary stents placed 2006  . History of appendectomy   . Hypercholesterolemia   . Hypertension   . Pulmonary embolism   . Sleep apnea     Past Surgical History:  Procedure Laterality Date  . APPENDECTOMY    . TRANSPHENOIDAL PITUITARY RESECTION  2003   recurrance treated with gamma knife    Current Outpatient Medications  Medication Sig Dispense Refill Last Dose  . CLINDAMYCIN HCL PO Take 300 mg by mouth every 8 (eight) hours.     .  Fluticasone-Salmeterol (ADVAIR DISKUS) 250-50 MCG/DOSE AEPB Inhale 1 puff into the lungs 2 (two) times daily.     Marland Kitchen ipratropium-albuterol (DUONEB) 0.5-2.5 (3) MG/3ML SOLN Take 3 mLs by nebulization every 4 (four) hours as needed.     . warfarin (COUMADIN) 10 MG tablet Take 10 mg by mouth. 15mg  M,W,F  10mg  Tue,Thur, and Sun      No current facility-administered medications for this visit.   No Known Allergies  Social History   Tobacco Use  . Smoking status: Former Smoker    Packs/day: 2.00    Years: 25.00    Pack years: 50.00    Quit date: 11/10/2011    Years since quitting: 8.8  . Smokeless tobacco: Never Used  Substance Use Topics  . Alcohol use: No    Family History  Problem Relation Age of Onset  . Heart disease Father      Review of Systems  Constitutional: Negative.   HENT: Negative.   Eyes: Negative.   Respiratory: Negative.   Cardiovascular: Negative.   Gastrointestinal: Negative.   Endocrine: Negative.   Genitourinary: Negative.   Musculoskeletal: Positive for arthralgias.  Skin: Negative.   Allergic/Immunologic: Negative.   Neurological: Negative.   Hematological: Negative.   Psychiatric/Behavioral: Negative.     Objective:  Physical Exam Constitutional:      Appearance: Normal appearance.  HENT:     Head: Normocephalic.  Eyes:     Pupils: Pupils are equal, round, and reactive to light.  Cardiovascular:     Rate and Rhythm: Normal rate and regular rhythm.     Heart sounds: Normal heart sounds.  Pulmonary:     Breath sounds: Normal breath sounds.  Abdominal:     Palpations: Abdomen is soft.     Tenderness: There is no abdominal tenderness.  Genitourinary:    Comments: Deferred Musculoskeletal:     Cervical back: Normal range of motion.     Comments: Examination of the right hip reveals no skin wounds or lesions. Mild trochanteric tenderness to palpation. He has restricted range of motion of the right hip. Pain with terminal flexion and rotation.  Pain in the position of impingement. Range of motion of the hip recreates his buttock pain.  Skin:    General: Skin is warm and dry.  Neurological:     Mental Status: He is alert and oriented to person, place, and time.  Psychiatric:        Mood and Affect: Mood normal.     Vital signs in last 24 hours: @VSRANGES @  Labs:   There is no height or weight on file to calculate BMI.   Imaging Review Plain radiographs demonstrate severe degenerative joint disease of the bilateral hip(s). The bone quality appears to be adequate for age and reported activity level.      Assessment/Plan:  End stage arthritis, right hip(s)  The patient history, physical examination, clinical judgement of the provider and imaging studies are consistent with end stage degenerative joint disease of the right hip(s) and total hip arthroplasty is deemed medically necessary. The treatment options including medical management, injection therapy, arthroscopy and arthroplasty were discussed at length. The risks and benefits of total hip arthroplasty were presented and reviewed. The risks due to aseptic loosening, infection, stiffness, dislocation/subluxation,  thromboembolic complications and other imponderables were discussed.  The patient acknowledged the explanation, agreed to proceed with the plan and consent was signed. Patient is being admitted for inpatient treatment for surgery, pain control, PT, OT, prophylactic antibiotics, VTE prophylaxis, progressive ambulation and ADL's and discharge planning.The patient is planning to be discharged home with his daughter after an overnight stay.

## 2020-09-10 NOTE — H&P (Signed)
TOTAL HIP ADMISSION H&P  Patient is admitted for right total hip arthroplasty.  Subjective:  Chief Complaint: right hip pain  HPI: Matthew Adams, 59 y.o. male, has a history of pain and functional disability in the right hip(s) due to arthritis and patient has failed non-surgical conservative treatments for greater than 12 weeks to include NSAID's and/or analgesics and activity modification.  Onset of symptoms was gradual starting 2 years ago with rapidlly worsening course since that time.The patient noted no past surgery on the right hip(s).  Patient currently rates pain in the right hip at 8 out of 10 with activity. Patient has worsening of pain with activity and weight bearing, pain that interfers with activities of daily living and pain with passive range of motion. Patient has evidence of subchondral sclerosis and joint space narrowing by imaging studies. This condition presents safety issues increasing the risk of falls.  There is no current active infection.  Patient Active Problem List   Diagnosis Date Noted  . History of pulmonary embolism 12/30/2011  . COPD, severe 12/30/2011  . Pulmonary cavitary lesion 12/30/2011  . Heart disease   . Hypertension   . Sleep apnea   . Hypercholesterolemia   . Diabetes mellitus    Past Medical History:  Diagnosis Date  . 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 COPD (chronic obstructive pulmonary disease)   . Diabetes mellitus   . Heart disease    Coronary stents placed 2006  . History of appendectomy   . Hypercholesterolemia   . Hypertension   . Pulmonary embolism   . Sleep apnea     Past Surgical History:  Procedure Laterality Date  . APPENDECTOMY    . TRANSPHENOIDAL PITUITARY RESECTION  2003   recurrance treated with gamma knife    Current Outpatient Medications  Medication Sig Dispense Refill Last Dose  . CLINDAMYCIN HCL PO Take 300 mg by mouth every 8 (eight) hours.     .  Fluticasone-Salmeterol (ADVAIR DISKUS) 250-50 MCG/DOSE AEPB Inhale 1 puff into the lungs 2 (two) times daily.     Marland Kitchen ipratropium-albuterol (DUONEB) 0.5-2.5 (3) MG/3ML SOLN Take 3 mLs by nebulization every 4 (four) hours as needed.     . warfarin (COUMADIN) 10 MG tablet Take 10 mg by mouth. 15mg  M,W,F  10mg  Tue,Thur, and Sun      No current facility-administered medications for this visit.   No Known Allergies  Social History   Tobacco Use  . Smoking status: Former Smoker    Packs/day: 2.00    Years: 25.00    Pack years: 50.00    Quit date: 11/10/2011    Years since quitting: 8.8  . Smokeless tobacco: Never Used  Substance Use Topics  . Alcohol use: No    Family History  Problem Relation Age of Onset  . Heart disease Father      Review of Systems  Constitutional: Negative.   HENT: Negative.   Eyes: Negative.   Respiratory: Negative.   Cardiovascular: Negative.   Gastrointestinal: Negative.   Endocrine: Negative.   Genitourinary: Negative.   Musculoskeletal: Positive for arthralgias.  Skin: Negative.   Allergic/Immunologic: Negative.   Neurological: Negative.   Hematological: Negative.   Psychiatric/Behavioral: Negative.     Objective:  Physical Exam Constitutional:      Appearance: Normal appearance.  HENT:     Head: Normocephalic.  Eyes:     Pupils: Pupils are equal, round, and reactive to light.  Cardiovascular:     Rate and Rhythm: Normal rate and regular rhythm.     Heart sounds: Normal heart sounds.  Pulmonary:     Breath sounds: Normal breath sounds.  Abdominal:     Palpations: Abdomen is soft.     Tenderness: There is no abdominal tenderness.  Genitourinary:    Comments: Deferred Musculoskeletal:     Cervical back: Normal range of motion.     Comments: Examination of the right hip reveals no skin wounds or lesions. Mild trochanteric tenderness to palpation. He has restricted range of motion of the right hip. Pain with terminal flexion and rotation.  Pain in the position of impingement. Range of motion of the hip recreates his buttock pain.  Skin:    General: Skin is warm and dry.  Neurological:     Mental Status: He is alert and oriented to person, place, and time.  Psychiatric:        Mood and Affect: Mood normal.     Vital signs in last 24 hours: @VSRANGES @  Labs:   There is no height or weight on file to calculate BMI.   Imaging Review Plain radiographs demonstrate severe degenerative joint disease of the bilateral hip(s). The bone quality appears to be adequate for age and reported activity level.      Assessment/Plan:  End stage arthritis, right hip(s)  The patient history, physical examination, clinical judgement of the provider and imaging studies are consistent with end stage degenerative joint disease of the right hip(s) and total hip arthroplasty is deemed medically necessary. The treatment options including medical management, injection therapy, arthroscopy and arthroplasty were discussed at length. The risks and benefits of total hip arthroplasty were presented and reviewed. The risks due to aseptic loosening, infection, stiffness, dislocation/subluxation,  thromboembolic complications and other imponderables were discussed.  The patient acknowledged the explanation, agreed to proceed with the plan and consent was signed. Patient is being admitted for inpatient treatment for surgery, pain control, PT, OT, prophylactic antibiotics, VTE prophylaxis, progressive ambulation and ADL's and discharge planning.The patient is planning to be discharged home with his daughter after an overnight stay.

## 2020-09-14 DIAGNOSIS — E038 Other specified hypothyroidism: Secondary | ICD-10-CM | POA: Diagnosis not present

## 2020-09-14 NOTE — Patient Instructions (Signed)
DUE TO COVID-19 ONLY ONE VISITOR IS ALLOWED TO COME WITH YOU AND STAY IN THE WAITING ROOM ONLY DURING PRE OP AND PROCEDURE DAY OF SURGERY. THE 1 VISITOR  MAY VISIT WITH YOU AFTER SURGERY IN YOUR PRIVATE ROOM DURING VISITING HOURS ONLY!  YOU NEED TO HAVE A COVID 19 TEST ON_12/6______ @_11 :55______, THIS TEST MUST BE DONE BEFORE SURGERY,  COVID TESTING SITE [place] [id number], IT IS ON THE RIGHT GOING OUT [address] APPROXIMATELY  2 MINUTES PAST [employer] ON THE RIGHT. ONCE YOUR COVID TEST IS COMPLETED,  PLEASE BEGIN THE QUARANTINE INSTRUCTIONS AS OUTLINED IN YOUR HANDOUT.                Matthew Adams    Your procedure is scheduled on: 09/19/20   Report to [facility] Main  Entrance   Report to admitting at 8:55 AM     Call this number if you have problems the morning of surgery [place], NO CHEWING GUM [place].  No food after midnight.    You may have clear liquid until 8:30 AM.    CLEAR LIQUID DIET   Foods Allowed                                                                     Foods Excluded  Coffee and tea, regular and decaf                             liquids that you cannot  Plain Jell-O any favor except red or purple                                           see through such as: Fruit ices (not with fruit pulp)                                     milk, soups, orange juice  Iced Popsicles                                    All solid food Carbonated beverages, regular and diet                                    Cranberry, grape and apple juices Sports drinks like Gatorade Lightly seasoned clear broth or consume(fat free) Sugar, honey syrup       At 8:00 AM drink pre surgery drink.   Nothing by mouth after 8:30 AM.    Take these medicines the morning of surgery with A SIP OF WATER: Gabapentin, Metoprolol, Levothyroxine, Protonix , Use your  inhalers and bring them with you to the hospital. Use your eye drops as usual  DO NOT TAKE ANY DIABETIC MEDICATIONS DAY OF YOUR SURGERY  You may not have any metal on your body including              piercings  Do not wear jewelry,  lotions, powders or deodorant              Men may shave face and neck.   Do not bring valuables to the hospital. [place].  Contacts, dentures or bridgework may not be worn into surgery.      Patients discharged the day of surgery will not be allowed to drive home.   IF YOU ARE HAVING SURGERY AND GOING HOME THE SAME DAY, YOU MUST HAVE AN ADULT TO DRIVE YOU HOME AND BE WITH YOU FOR 24 HOURS.   YOU MAY GO HOME BY TAXI OR UBER OR ORTHERWISE, BUT AN ADULT MUST ACCOMPANY YOU HOME AND STAY WITH YOU FOR 24 HOURS.  Name and phone number of your driver:  Special Instructions: N/A              Please read over the following fact sheets you were given: _____________________________________________________________________             [facility] - Preparing for Surgery Before surgery, you can play an important role.   Because skin is not sterile, your skin needs to be as free of germs as possible.   You can reduce the number of germs on your skin by washing with CHG (chlorahexidine gluconate) soap before surgery.    CHG is an antiseptic cleaner which kills germs and bonds with the skin to continue killing germs even after washing. Please DO NOT use if you have an allergy to CHG or antibacterial soaps.  If your skin becomes reddened/irritated stop using the CHG and inform your nurse when you arrive at Short Stay. .  You may shave your face/neck.  Please follow these instructions carefully:  1.  Shower with CHG Soap the night before surgery and the  morning of Surgery.  2.  If you choose to wash your hair, wash your hair first as usual with your  normal  shampoo.  3.  After you  shampoo, rinse your hair and body thoroughly to remove the  shampoo.                                        4.  Use CHG as you would any other liquid soap.  You can apply chg directly  to the skin and wash                       Gently with a scrungie or clean washcloth.  5.  Apply the CHG Soap to your body ONLY FROM THE NECK DOWN.   Do not use on face/ open                           Wound or open sores. Avoid contact with eyes, ears mouth and genitals (private parts).                       Wash face,  Genitals (private parts) with your normal soap.             6.  Wash thoroughly, paying special attention  to the area where your surgery  will be performed.  7.  Thoroughly rinse your body with warm water from the neck down.  8.  DO NOT shower/wash with your normal soap after using and rinsing off  the CHG Soap.             9.  Pat yourself dry with a clean towel.            10.  Wear clean pajamas.            11.  Place clean sheets on your bed the night of your first shower and do not  sleep with pets. Day of Surgery : Do not apply any lotions/deodorants the morning of surgery.  Please wear clean clothes to the hospital/surgery center.  FAILURE TO FOLLOW THESE INSTRUCTIONS MAY RESULT IN THE CANCELLATION OF YOUR SURGERY PATIENT SIGNATURE_________________________________  NURSE SIGNATURE__________________________________  ________________________________________________________________________   Matthew Adams  An incentive spirometer is a tool that can help keep your lungs clear and active. This tool measures how well you are filling your lungs with each breath. Taking long deep breaths may help reverse or decrease the chance of developing breathing (pulmonary) problems (especially infection) following:  A long period of time when you are unable to move or be active. BEFORE THE PROCEDURE   If the spirometer includes an indicator to show your best effort, your nurse or respiratory  therapist will set it to a desired goal.  If possible, sit up straight or lean slightly forward. Try not to slouch.  Hold the incentive spirometer in an upright position. INSTRUCTIONS FOR USE  1. Sit on the edge of your bed if possible, or sit up as far as you can in bed or on a chair. 2. Hold the incentive spirometer in an upright position. 3. Breathe out normally. 4. Place the mouthpiece in your mouth and seal your lips tightly around it. 5. Breathe in slowly and as deeply as possible, raising the piston or the ball toward the top of the column. 6. Hold your breath for 3-5 seconds or for as long as possible. Allow the piston or ball to fall to the bottom of the column. 7. Remove the mouthpiece from your mouth and breathe out normally. 8. Rest for a few seconds and repeat Steps 1 through 7 at least 10 times every 1-2 hours when you are awake. Take your time and take a few normal breaths between deep breaths. 9. The spirometer may include an indicator to show your best effort. Use the indicator as a goal to work toward during each repetition. 10. After each set of 10 deep breaths, practice coughing to be sure your lungs are clear. If you have an incision (the cut made at the time of surgery), support your incision when coughing by placing a pillow or rolled up towels firmly against it. Once you are able to get out of bed, walk around indoors and cough well. You may stop using the incentive spirometer when instructed by your caregiver.  RISKS AND COMPLICATIONS  Take your time so you do not get dizzy or light-headed.  If you are in pain, you may need to take or ask for pain medication before doing incentive spirometry. It is harder to take a deep breath if you are having pain. AFTER USE  Rest and breathe slowly and easily.  It can be helpful to keep track of a log of your progress. Your caregiver can provide you with a simple table  to help with this. If you are using the spirometer at home,  follow these instructions: [place] IF:   You are having difficultly using the spirometer.  You have trouble using the spirometer as often as instructed.  Your pain medication is not giving enough relief while using the spirometer.  You develop fever of 100.5 F (38.1 C) or higher. SEEK IMMEDIATE MEDICAL CARE IF:   You cough up bloody sputum that had not been present before.  You develop fever of 102 F (38.9 C) or greater.  You develop worsening pain at or near the incision site. MAKE SURE YOU:   Understand these instructions.  Will watch your condition.  Will get help right away if you are not doing well or get worse. Document Released: 02/09/2007 Document Revised: 12/22/2011 Document Reviewed: 04/12/2007 [facility] Patient Information 2014 [place], [place].   ________________________________________________________________________

## 2020-09-17 ENCOUNTER — Encounter (HOSPITAL_COMMUNITY): Payer: Self-pay

## 2020-09-17 ENCOUNTER — Other Ambulatory Visit: Payer: Self-pay

## 2020-09-17 ENCOUNTER — Encounter (HOSPITAL_COMMUNITY)
Admission: RE | Admit: 2020-09-17 | Discharge: 2020-09-17 | Disposition: A | Discharge status: Home / Self Care | Payer: PPO | Source: Ambulatory Visit | Attending: Orthopedic Surgery | Admitting: Orthopedic Surgery

## 2020-09-17 ENCOUNTER — Other Ambulatory Visit (HOSPITAL_COMMUNITY)
Admission: RE | Admit: 2020-09-17 | Discharge: 2020-09-17 | Disposition: A | Discharge status: Home / Self Care | Payer: PPO | Source: Ambulatory Visit | Attending: Orthopedic Surgery | Admitting: Orthopedic Surgery

## 2020-09-17 DIAGNOSIS — Z7951 Long term (current) use of inhaled steroids: Secondary | ICD-10-CM | POA: Diagnosis not present

## 2020-09-17 DIAGNOSIS — Z86711 Personal history of pulmonary embolism: Secondary | ICD-10-CM | POA: Diagnosis not present

## 2020-09-17 DIAGNOSIS — Z7901 Long term (current) use of anticoagulants: Secondary | ICD-10-CM | POA: Diagnosis not present

## 2020-09-17 DIAGNOSIS — J449 Chronic obstructive pulmonary disease, unspecified: Secondary | ICD-10-CM | POA: Diagnosis present

## 2020-09-17 DIAGNOSIS — Z9049 Acquired absence of other specified parts of digestive tract: Secondary | ICD-10-CM | POA: Diagnosis not present

## 2020-09-17 DIAGNOSIS — E119 Type 2 diabetes mellitus without complications: Secondary | ICD-10-CM | POA: Diagnosis present

## 2020-09-17 DIAGNOSIS — M1612 Unilateral primary osteoarthritis, left hip: Secondary | ICD-10-CM | POA: Diagnosis not present

## 2020-09-17 DIAGNOSIS — Z87891 Personal history of nicotine dependence: Secondary | ICD-10-CM | POA: Diagnosis not present

## 2020-09-17 DIAGNOSIS — Z955 Presence of coronary angioplasty implant and graft: Secondary | ICD-10-CM | POA: Diagnosis not present

## 2020-09-17 DIAGNOSIS — I10 Essential (primary) hypertension: Secondary | ICD-10-CM | POA: Diagnosis present

## 2020-09-17 DIAGNOSIS — M879 Osteonecrosis, unspecified: Secondary | ICD-10-CM | POA: Diagnosis present

## 2020-09-17 DIAGNOSIS — Z01812 Encounter for preprocedural laboratory examination: Secondary | ICD-10-CM | POA: Insufficient documentation

## 2020-09-17 DIAGNOSIS — Z923 Personal history of irradiation: Secondary | ICD-10-CM | POA: Diagnosis not present

## 2020-09-17 DIAGNOSIS — G4730 Sleep apnea, unspecified: Secondary | ICD-10-CM | POA: Diagnosis present

## 2020-09-17 DIAGNOSIS — Z8249 Family history of ischemic heart disease and other diseases of the circulatory system: Secondary | ICD-10-CM | POA: Diagnosis not present

## 2020-09-17 DIAGNOSIS — E039 Hypothyroidism, unspecified: Secondary | ICD-10-CM | POA: Diagnosis present

## 2020-09-17 DIAGNOSIS — I2510 Atherosclerotic heart disease of native coronary artery without angina pectoris: Secondary | ICD-10-CM | POA: Diagnosis present

## 2020-09-17 DIAGNOSIS — M87851 Other osteonecrosis, right femur: Secondary | ICD-10-CM | POA: Diagnosis not present

## 2020-09-17 DIAGNOSIS — Z471 Aftercare following joint replacement surgery: Secondary | ICD-10-CM | POA: Diagnosis not present

## 2020-09-17 DIAGNOSIS — Z96641 Presence of right artificial hip joint: Secondary | ICD-10-CM | POA: Diagnosis not present

## 2020-09-17 DIAGNOSIS — I252 Old myocardial infarction: Secondary | ICD-10-CM | POA: Diagnosis not present

## 2020-09-17 DIAGNOSIS — E7800 Pure hypercholesterolemia, unspecified: Secondary | ICD-10-CM | POA: Diagnosis not present

## 2020-09-17 DIAGNOSIS — Z20822 Contact with and (suspected) exposure to covid-19: Secondary | ICD-10-CM | POA: Diagnosis present

## 2020-09-17 DIAGNOSIS — M1611 Unilateral primary osteoarthritis, right hip: Secondary | ICD-10-CM | POA: Diagnosis present

## 2020-09-17 HISTORY — DX: Unspecified osteoarthritis, unspecified site: M19.90

## 2020-09-17 HISTORY — DX: Acute myocardial infarction, unspecified: I21.9

## 2020-09-17 HISTORY — DX: Atherosclerotic heart disease of native coronary artery without angina pectoris: I25.10

## 2020-09-17 HISTORY — DX: Dyspnea, unspecified: R06.00

## 2020-09-17 LAB — CBC
HCT: 44.1 % (ref 39.0–52.0)
Hemoglobin: 14.4 g/dL (ref 13.0–17.0)
MCH: 28.4 pg (ref 26.0–34.0)
MCHC: 32.7 g/dL (ref 30.0–36.0)
MCV: 87 fL (ref 80.0–100.0)
Platelets: 241 10*3/uL (ref 150–400)
RBC: 5.07 MIL/uL (ref 4.22–5.81)
RDW: 14.7 % (ref 11.5–15.5)
WBC: 13.7 10*3/uL — ABNORMAL HIGH (ref 4.0–10.5)
nRBC: 0 % (ref 0.0–0.2)

## 2020-09-17 LAB — COMPREHENSIVE METABOLIC PANEL
ALT: 13 U/L (ref 0–44)
AST: 15 U/L (ref 15–41)
Albumin: 4.3 g/dL (ref 3.5–5.0)
Alkaline Phosphatase: 44 U/L (ref 38–126)
Anion gap: 10 (ref 5–15)
BUN: 19 mg/dL (ref 8–23)
CO2: 25 mmol/L (ref 22–32)
Calcium: 9.5 mg/dL (ref 8.9–10.3)
Chloride: 103 mmol/L (ref 98–111)
Creatinine, Ser: 0.9 mg/dL (ref 0.61–1.24)
GFR, Estimated: >60 mL/min (ref >60)
Glucose, Bld: 92 mg/dL (ref 70–99)
Potassium: 4 mmol/L (ref 3.5–5.1)
Sodium: 138 mmol/L (ref 135–145)
Total Bilirubin: 0.8 mg/dL (ref 0.3–1.2)
Total Protein: 7.4 g/dL (ref 6.5–8.1)

## 2020-09-17 LAB — SARS CORONAVIRUS 2 (TAT 6-24 HRS): SARS Coronavirus 2: NEGATIVE

## 2020-09-17 LAB — PROTIME-INR
INR: 1 (ref 0.8–1.2)
Prothrombin Time: 12.3 seconds (ref 11.4–15.2)

## 2020-09-17 LAB — SURGICAL PCR SCREEN
MRSA, PCR: NEGATIVE
Staphylococcus aureus: NEGATIVE

## 2020-09-17 LAB — HEMOGLOBIN A1C
Hgb A1c MFr Bld: 5.7 % — ABNORMAL HIGH (ref 4.8–5.6)
Mean Plasma Glucose: 116.89 mg/dL

## 2020-09-17 NOTE — Progress Notes (Signed)
COVID Vaccine Completed:Yes Date COVID Vaccine completed:02/08/20- Booster 09/14/20 COVID vaccine manufacturer: Pfizer      PCP - Dr. Lucilla Lame Cardiologist -  Dr. Otho Perl  Chest x-ray - no EKG - 08/14/20-[facility] Requested 12/6 Stress Test - 12/23/17-[facility] ECHO - no Cardiac Cath - 3/19-21--[facility] CABG x 4 2014 Pacemaker/ICD device last checked:NA  Sleep Study - yes CPAP - yes  Fasting Blood Sugar - no-no longer diabetic after 2003 surgery Checks Blood Sugar _____ times a day  Blood Thinner Instructions:Xarelto/ dr. Otho Perl Aspirin Instructions:Stop 5 days prior to DOS/ Dr. Otho Perl Last Dose:09/13/20  Anesthesia review:   Patient denies shortness of breath, fever, cough and chest pain at PAT appointment yes   Patient verbalized understanding of instructions that were given to them at the PAT appointment. Patient was also instructed that they will need to review over the PAT instructions again at home before surgery. Yes Pt avoids stairs but reports no SOB working or with ADLs. Pt has been taking Ibuprofen 2-3 times daily for pain. He said he wasn't told to stop that. Last dose 09/17/20 in the AM

## 2020-09-18 NOTE — Anesthesia Preprocedure Evaluation (Addendum)
Anesthesia Evaluation  Patient identified by MRN, date of birth, ID band Patient awake    Reviewed: Allergy & Precautions, NPO status , Patient's Chart, lab work & pertinent test results  Airway Mallampati: III  TM Distance: >3 FB Neck ROM: Full    Dental no notable dental hx.    Pulmonary sleep apnea , COPD,  COPD inhaler, former smoker, PE   Pulmonary exam normal breath sounds clear to auscultation       Cardiovascular hypertension, Pt. on home beta blockers + CAD, + Past MI, + Cardiac Stents and + CABG  Normal cardiovascular exam Rhythm:Regular Rate:Normal     Neuro/Psych negative neurological ROS  negative psych ROS   GI/Hepatic negative GI ROS, Neg liver ROS,   Endo/Other  diabetesHypothyroidism   Renal/GU negative Renal ROS     Musculoskeletal  (+) Arthritis ,   Abdominal   Peds  Hematology HLD   Anesthesia Other Findings avascular necrosis of bone of hip  Reproductive/Obstetrics                           Anesthesia Physical Anesthesia Plan  ASA: III  Anesthesia Plan: General   Post-op Pain Management:    Induction: Intravenous  PONV Risk Score and Plan: 2 and Ondansetron, Dexamethasone, Midazolam and Treatment may vary due to age or medical condition  Airway Management Planned: Oral ETT  Additional Equipment:   Intra-op Plan:   Post-operative Plan: Extubation in OR  Informed Consent: I have reviewed the patients History and Physical, chart, labs and discussed the procedure including the risks, benefits and alternatives for the proposed anesthesia with the patient or authorized representative who has indicated his/her understanding and acceptance.     Dental advisory given  Plan Discussed with: CRNA  Anesthesia Plan Comments: (Reviewed PAT note 09/17/2020, Konrad Felix, PA-C)       Anesthesia Quick Evaluation

## 2020-09-18 NOTE — Progress Notes (Signed)
Anesthesia Chart Review   Case: [id number] Date/Time: 09/19/20 1109   Procedure: TOTAL HIP ARTHROPLASTY ANTERIOR APPROACH (Right Hip)   Anesthesia type: Spinal   Pre-op diagnosis: avascular necrosis of bone of hip   Location: [facility] ROOM 07 / [facility]   Surgeons: Rod Can, MD      DISCUSSION:59 y.o. former smoker with h/o HTN, COPD, CAD (stent 2006, CABG 2014), sleep apnea, AVN right hip scheduled for above procedure 09/19/2020 with Dr. Rod Can.   Pt seen by cardiologist 08/14/2020 for preoperative evaluation.  Stable at this visit without cv sx. RCRI, Risk of major cardiac event: ~6%. Per OV note, "Proceed with your hip surgery at relatively low risk as we discussed."  Pt reports he was advised to hold Xarelto 5 days prior to surgery.   Anticipate pt can proceed with planned procedure barring acute status change.   VS: BP (!) 145/82   Pulse 85   Temp 36.6 C (Oral)   Resp 18   Ht 5\' 9"  (1.753 m)   Wt 86.8 kg   SpO2 96%   BMI 28.25 kg/m   PROVIDERS: Nicoletta Dress, MD is PCP   Abran Richard, MD is Cardiologist  LABS: Labs reviewed: Acceptable for surgery. (all labs ordered are listed, but only abnormal results are displayed)  Labs Reviewed  CBC - Abnormal; Notable for the following components:      Result Value   WBC 13.7 (*)    All other components within normal limits  HEMOGLOBIN A1C - Abnormal; Notable for the following components:   Hgb A1c MFr Bld 5.7 (*)    All other components within normal limits  SURGICAL PCR SCREEN  COMPREHENSIVE METABOLIC PANEL  PROTIME-INR  TYPE AND SCREEN     IMAGES:   EKG: 08/14/2020 Rate 76 bpm  Sinus rhythm first degree AV block with occasional PCVs Possible left atrial enlargement  T wave abnormality, consider anterior ischemia  CV: Cardiac Cath 12/29/2017 Conclusions  Diagnostic Procedure Summary  1. Severe native CAD  2. All grafts are patent  3. Normal LV systolic function  Diagnostic Procedure  Recommendations  Medical therapy   Past Medical History:  Diagnosis Date  . Arthritis    hips  . 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 COPD (chronic obstructive pulmonary disease) ([place])   . Coronary artery disease   . Dyspnea   . Heart disease    Coronary stents placed 2006  . History of appendectomy   . Hypercholesterolemia   . Hypertension   . Myocardial infarction [facility])    pt denies  . Pneumonia 07/2020  . Pulmonary embolism ([place])   . Sleep apnea     Past Surgical History:  Procedure Laterality Date  . APPENDECTOMY    . CARDIAC CATHETERIZATION  2019   1 stent  . CORONARY ARTERY BYPASS GRAFT  2014  . TRANSPHENOIDAL PITUITARY RESECTION  2003   recurrance treated with gamma knife    MEDICATIONS: . albuterol (PROVENTIL) (2.5 MG/3ML) 0.083% nebulizer solution  . albuterol (VENTOLIN HFA) 108 (90 Base) MCG/ACT inhaler  . cetirizine (ZYRTEC) 10 MG tablet  . cyclobenzaprine (FLEXERIL) 5 MG tablet  . Fluticasone-Salmeterol (ADVAIR) 500-50 MCG/DOSE AEPB  . gabapentin (NEURONTIN) 300 MG capsule  . hydrocortisone (CORTEF) 10 MG tablet  . levothyroxine (SYNTHROID) 88 MCG tablet  . metoprolol tartrate (LOPRESSOR) 25 MG tablet  . montelukast (SINGULAIR) 10 MG tablet  . NEOMYCIN-POLYMYXIN-HYDROCORTISONE (CORTISPORIN) 1 % SOLN  OTIC solution  . nitroGLYCERIN (NITROSTAT) 0.4 MG SL tablet  . pantoprazole (PROTONIX) 40 MG tablet  . ranolazine (RANEXA) 500 MG 12 hr tablet  . sildenafil (REVATIO) 20 MG tablet  . simvastatin (ZOCOR) 40 MG tablet  . SPIRIVA RESPIMAT 2.5 MCG/ACT AERS  . tamsulosin (FLOMAX) 0.4 MG CAPS capsule  . Testosterone 20.25 MG/ACT (1.62%) GEL  . XARELTO 15 MG TABS tablet   No current facility-administered medications for this encounter.   Konrad Felix, PA-C [facility] Pre-Surgical Testing [phone]

## 2020-09-19 ENCOUNTER — Inpatient Hospital Stay (HOSPITAL_COMMUNITY)
Admission: RE | Admit: 2020-09-19 | Discharge: 2020-09-20 | DRG: 470 | Disposition: A | Discharge status: Home / Self Care | Payer: PPO | Attending: Orthopedic Surgery | Admitting: Orthopedic Surgery

## 2020-09-19 ENCOUNTER — Inpatient Hospital Stay (HOSPITAL_COMMUNITY): Payer: PPO | Admitting: Anesthesiology

## 2020-09-19 ENCOUNTER — Encounter (HOSPITAL_COMMUNITY): Payer: Self-pay | Admitting: Orthopedic Surgery

## 2020-09-19 ENCOUNTER — Other Ambulatory Visit: Payer: Self-pay

## 2020-09-19 ENCOUNTER — Inpatient Hospital Stay (HOSPITAL_COMMUNITY): Payer: PPO

## 2020-09-19 ENCOUNTER — Encounter (HOSPITAL_COMMUNITY)
Admission: RE | Disposition: A | Discharge status: Home / Self Care | Payer: Self-pay | Source: Home / Self Care | Attending: Orthopedic Surgery

## 2020-09-19 ENCOUNTER — Inpatient Hospital Stay (HOSPITAL_COMMUNITY): Payer: PPO | Admitting: Physician Assistant

## 2020-09-19 DIAGNOSIS — M1611 Unilateral primary osteoarthritis, right hip: Secondary | ICD-10-CM | POA: Diagnosis present

## 2020-09-19 DIAGNOSIS — I2510 Atherosclerotic heart disease of native coronary artery without angina pectoris: Secondary | ICD-10-CM | POA: Diagnosis present

## 2020-09-19 DIAGNOSIS — Z86711 Personal history of pulmonary embolism: Secondary | ICD-10-CM | POA: Diagnosis not present

## 2020-09-19 DIAGNOSIS — Z9049 Acquired absence of other specified parts of digestive tract: Secondary | ICD-10-CM

## 2020-09-19 DIAGNOSIS — S72041A Displaced fracture of base of neck of right femur, initial encounter for closed fracture: Secondary | ICD-10-CM

## 2020-09-19 DIAGNOSIS — Z7901 Long term (current) use of anticoagulants: Secondary | ICD-10-CM | POA: Diagnosis not present

## 2020-09-19 DIAGNOSIS — E039 Hypothyroidism, unspecified: Secondary | ICD-10-CM | POA: Diagnosis present

## 2020-09-19 DIAGNOSIS — Z7951 Long term (current) use of inhaled steroids: Secondary | ICD-10-CM | POA: Diagnosis not present

## 2020-09-19 DIAGNOSIS — Z923 Personal history of irradiation: Secondary | ICD-10-CM | POA: Diagnosis not present

## 2020-09-19 DIAGNOSIS — Z8249 Family history of ischemic heart disease and other diseases of the circulatory system: Secondary | ICD-10-CM

## 2020-09-19 DIAGNOSIS — M879 Osteonecrosis, unspecified: Secondary | ICD-10-CM | POA: Diagnosis present

## 2020-09-19 DIAGNOSIS — M87051 Idiopathic aseptic necrosis of right femur: Secondary | ICD-10-CM | POA: Diagnosis present

## 2020-09-19 DIAGNOSIS — E119 Type 2 diabetes mellitus without complications: Secondary | ICD-10-CM | POA: Diagnosis present

## 2020-09-19 DIAGNOSIS — I252 Old myocardial infarction: Secondary | ICD-10-CM | POA: Diagnosis not present

## 2020-09-19 DIAGNOSIS — Z87891 Personal history of nicotine dependence: Secondary | ICD-10-CM

## 2020-09-19 DIAGNOSIS — I10 Essential (primary) hypertension: Secondary | ICD-10-CM | POA: Diagnosis present

## 2020-09-19 DIAGNOSIS — Z96641 Presence of right artificial hip joint: Secondary | ICD-10-CM | POA: Diagnosis not present

## 2020-09-19 DIAGNOSIS — Z20822 Contact with and (suspected) exposure to covid-19: Secondary | ICD-10-CM | POA: Diagnosis present

## 2020-09-19 DIAGNOSIS — J449 Chronic obstructive pulmonary disease, unspecified: Secondary | ICD-10-CM | POA: Diagnosis present

## 2020-09-19 DIAGNOSIS — Z09 Encounter for follow-up examination after completed treatment for conditions other than malignant neoplasm: Secondary | ICD-10-CM

## 2020-09-19 DIAGNOSIS — G4730 Sleep apnea, unspecified: Secondary | ICD-10-CM | POA: Diagnosis present

## 2020-09-19 DIAGNOSIS — Z955 Presence of coronary angioplasty implant and graft: Secondary | ICD-10-CM

## 2020-09-19 DIAGNOSIS — Z471 Aftercare following joint replacement surgery: Secondary | ICD-10-CM | POA: Diagnosis not present

## 2020-09-19 HISTORY — PX: TOTAL HIP ARTHROPLASTY: SHX124

## 2020-09-19 HISTORY — DX: Idiopathic aseptic necrosis of right femur: M87.051

## 2020-09-19 LAB — TYPE AND SCREEN
ABO/RH(D): O POS
Antibody Screen: NEGATIVE

## 2020-09-19 LAB — URINALYSIS, ROUTINE W REFLEX MICROSCOPIC
Bilirubin Urine: NEGATIVE
Glucose, UA: NEGATIVE mg/dL
Hgb urine dipstick: NEGATIVE
Ketones, ur: NEGATIVE mg/dL
Leukocytes,Ua: NEGATIVE
Nitrite: NEGATIVE
Protein, ur: NEGATIVE mg/dL
Specific Gravity, Urine: 1.01 (ref 1.005–1.030)
pH: 6 (ref 5.0–8.0)

## 2020-09-19 LAB — ABO/RH: ABO/RH(D): O POS

## 2020-09-19 SURGERY — ARTHROPLASTY, HIP, TOTAL, ANTERIOR APPROACH
Anesthesia: General | Site: Hip | Laterality: Right

## 2020-09-19 MED ORDER — ONDANSETRON HCL 4 MG/2ML IJ SOLN
INTRAMUSCULAR | Status: DC | PRN
  Administered 2020-09-19: 4 mg via INTRAVENOUS

## 2020-09-19 MED ORDER — MOMETASONE FURO-FORMOTEROL FUM 200-5 MCG/ACT IN AERO
2.0000 | INHALATION_SPRAY | Freq: Two times a day (BID) | RESPIRATORY_TRACT | Status: DC
  Administered 2020-09-19 – 2020-09-20 (×2): 2 via RESPIRATORY_TRACT
  Filled 2020-09-19: qty 8.8

## 2020-09-19 MED ORDER — PROPOFOL 500 MG/50ML IV EMUL
INTRAVENOUS | Status: AC
  Filled 2020-09-19: qty 50

## 2020-09-19 MED ORDER — FENTANYL CITRATE (PF) 100 MCG/2ML IJ SOLN
25.0000 ug | INTRAMUSCULAR | Status: DC | PRN
  Administered 2020-09-19 (×3): 50 ug via INTRAVENOUS

## 2020-09-19 MED ORDER — POVIDONE-IODINE 10 % EX SWAB: 2.0000 "application " | Freq: Once | CUTANEOUS | Status: DC

## 2020-09-19 MED ORDER — DIPHENHYDRAMINE HCL 12.5 MG/5ML PO ELIX: 12.5000 mg | ORAL_SOLUTION | ORAL | Status: DC | PRN

## 2020-09-19 MED ORDER — ATORVASTATIN CALCIUM 20 MG PO TABS
20.0000 mg | ORAL_TABLET | Freq: Every day | ORAL | Status: DC
  Administered 2020-09-19: 20 mg via ORAL
  Filled 2020-09-19: qty 1

## 2020-09-19 MED ORDER — PANTOPRAZOLE SODIUM 40 MG PO TBEC: 40.0000 mg | DELAYED_RELEASE_TABLET | Freq: Every day | ORAL | Status: DC

## 2020-09-19 MED ORDER — METOPROLOL TARTRATE 25 MG PO TABS
25.0000 mg | ORAL_TABLET | Freq: Every day | ORAL | Status: DC
  Administered 2020-09-20: 25 mg via ORAL
  Filled 2020-09-19: qty 1

## 2020-09-19 MED ORDER — FENTANYL CITRATE (PF) 100 MCG/2ML IJ SOLN
INTRAMUSCULAR | Status: AC
  Filled 2020-09-19: qty 2

## 2020-09-19 MED ORDER — SILDENAFIL CITRATE 20 MG PO TABS
20.0000 mg | ORAL_TABLET | Freq: Every day | ORAL | Status: DC | PRN
  Filled 2020-09-19: qty 5

## 2020-09-19 MED ORDER — DEXAMETHASONE SODIUM PHOSPHATE 10 MG/ML IJ SOLN
INTRAMUSCULAR | Status: AC
  Filled 2020-09-19: qty 1

## 2020-09-19 MED ORDER — ALUM & MAG HYDROXIDE-SIMETH 200-200-20 MG/5ML PO SUSP
30.0000 mL | ORAL | Status: DC | PRN
  Administered 2020-09-20: 30 mL via ORAL
  Filled 2020-09-19: qty 30

## 2020-09-19 MED ORDER — ISOPROPYL ALCOHOL 70 % SOLN
Status: DC | PRN
  Administered 2020-09-19: 1 via TOPICAL

## 2020-09-19 MED ORDER — SODIUM CHLORIDE (PF) 0.9 % IJ SOLN
INTRAMUSCULAR | Status: AC
  Filled 2020-09-19: qty 50

## 2020-09-19 MED ORDER — PHENOL 1.4 % MT LIQD: 1.0000 | OROMUCOSAL | Status: DC | PRN

## 2020-09-19 MED ORDER — METOPROLOL TARTRATE 12.5 MG HALF TABLET
12.5000 mg | ORAL_TABLET | Freq: Every day | ORAL | Status: DC
  Administered 2020-09-19: 12.5 mg via ORAL
  Filled 2020-09-19: qty 1

## 2020-09-19 MED ORDER — ACETAMINOPHEN 10 MG/ML IV SOLN
INTRAVENOUS | Status: AC
  Filled 2020-09-19: qty 100

## 2020-09-19 MED ORDER — SIMVASTATIN 40 MG PO TABS: 40.0000 mg | ORAL_TABLET | Freq: Every day | ORAL | Status: DC

## 2020-09-19 MED ORDER — WATER FOR IRRIGATION, STERILE IR SOLN
Status: DC | PRN
  Administered 2020-09-19: 2000 mL

## 2020-09-19 MED ORDER — UMECLIDINIUM BROMIDE 62.5 MCG/INH IN AEPB
1.0000 | INHALATION_SPRAY | Freq: Every day | RESPIRATORY_TRACT | Status: DC
  Administered 2020-09-20: 1 via RESPIRATORY_TRACT
  Filled 2020-09-19: qty 7

## 2020-09-19 MED ORDER — SODIUM CHLORIDE 0.9 % IR SOLN
Status: DC | PRN
  Administered 2020-09-19: 3000 mL

## 2020-09-19 MED ORDER — DEXAMETHASONE SODIUM PHOSPHATE 10 MG/ML IJ SOLN
10.0000 mg | Freq: Once | INTRAMUSCULAR | Status: AC
  Administered 2020-09-20: 10 mg via INTRAVENOUS
  Filled 2020-09-19: qty 1

## 2020-09-19 MED ORDER — FENTANYL CITRATE (PF) 250 MCG/5ML IJ SOLN
INTRAMUSCULAR | Status: AC
  Filled 2020-09-19: qty 5

## 2020-09-19 MED ORDER — POLYETHYLENE GLYCOL 3350 17 G PO PACK: 17.0000 g | PACK | Freq: Every day | ORAL | Status: DC | PRN

## 2020-09-19 MED ORDER — SODIUM CHLORIDE (PF) 0.9 % IJ SOLN
INTRAMUSCULAR | Status: DC | PRN
  Administered 2020-09-19: 30 mL via INTRAVENOUS

## 2020-09-19 MED ORDER — LEVOTHYROXINE SODIUM 75 MCG PO TABS: 75.0000 ug | ORAL_TABLET | Freq: Every day | ORAL | Status: DC

## 2020-09-19 MED ORDER — SODIUM CHLORIDE 0.9 % IV SOLN: INTRAVENOUS | Status: DC

## 2020-09-19 MED ORDER — LEVOTHYROXINE SODIUM 75 MCG PO TABS
75.0000 ug | ORAL_TABLET | Freq: Every day | ORAL | Status: DC
  Administered 2020-09-20: 75 ug via ORAL
  Filled 2020-09-19: qty 1

## 2020-09-19 MED ORDER — ONDANSETRON HCL 4 MG PO TABS: 4.0000 mg | ORAL_TABLET | Freq: Four times a day (QID) | ORAL | Status: DC | PRN

## 2020-09-19 MED ORDER — CHLORHEXIDINE GLUCONATE 0.12 % MT SOLN
15.0000 mL | Freq: Once | OROMUCOSAL | Status: AC
  Administered 2020-09-19: 15 mL via OROMUCOSAL

## 2020-09-19 MED ORDER — ONDANSETRON HCL 4 MG/2ML IJ SOLN
INTRAMUSCULAR | Status: AC
  Filled 2020-09-19: qty 2

## 2020-09-19 MED ORDER — METOPROLOL TARTRATE 12.5 MG HALF TABLET: 12.5000 mg | ORAL_TABLET | ORAL | Status: DC

## 2020-09-19 MED ORDER — CEFAZOLIN SODIUM-DEXTROSE 2-4 GM/100ML-% IV SOLN
2.0000 g | Freq: Four times a day (QID) | INTRAVENOUS | Status: AC
  Administered 2020-09-19 – 2020-09-20 (×2): 2 g via INTRAVENOUS
  Filled 2020-09-19 (×2): qty 100

## 2020-09-19 MED ORDER — TRANEXAMIC ACID-NACL 1000-0.7 MG/100ML-% IV SOLN
1000.0000 mg | INTRAVENOUS | Status: AC
  Administered 2020-09-19: 1000 mg via INTRAVENOUS
  Filled 2020-09-19: qty 100

## 2020-09-19 MED ORDER — BUPIVACAINE-EPINEPHRINE (PF) 0.25% -1:200000 IJ SOLN
INTRAMUSCULAR | Status: AC
  Filled 2020-09-19: qty 30

## 2020-09-19 MED ORDER — KETOROLAC TROMETHAMINE 30 MG/ML IJ SOLN
INTRAMUSCULAR | Status: DC | PRN
  Administered 2020-09-19: 30 mg via INTRAVENOUS

## 2020-09-19 MED ORDER — ROCURONIUM BROMIDE 10 MG/ML (PF) SYRINGE
PREFILLED_SYRINGE | INTRAVENOUS | Status: DC | PRN
  Administered 2020-09-19: 10 mg via INTRAVENOUS
  Administered 2020-09-19: 60 mg via INTRAVENOUS

## 2020-09-19 MED ORDER — SUGAMMADEX SODIUM 200 MG/2ML IV SOLN
INTRAVENOUS | Status: DC | PRN
  Administered 2020-09-19: 200 mg via INTRAVENOUS

## 2020-09-19 MED ORDER — HYDROCODONE-ACETAMINOPHEN 5-325 MG PO TABS
1.0000 | ORAL_TABLET | ORAL | Status: DC | PRN
  Administered 2020-09-19 – 2020-09-20 (×3): 2 via ORAL
  Filled 2020-09-19 (×2): qty 2

## 2020-09-19 MED ORDER — METOCLOPRAMIDE HCL 5 MG/ML IJ SOLN: 5.0000 mg | Freq: Three times a day (TID) | INTRAMUSCULAR | Status: DC | PRN

## 2020-09-19 MED ORDER — ALBUMIN HUMAN 5 % IV SOLN
INTRAVENOUS | Status: AC
  Filled 2020-09-19: qty 500

## 2020-09-19 MED ORDER — KETOROLAC TROMETHAMINE 30 MG/ML IJ SOLN
INTRAMUSCULAR | Status: AC
  Filled 2020-09-19: qty 1

## 2020-09-19 MED ORDER — METHOCARBAMOL 500 MG PO TABS
500.0000 mg | ORAL_TABLET | Freq: Four times a day (QID) | ORAL | Status: DC | PRN
  Administered 2020-09-20: 500 mg via ORAL
  Filled 2020-09-19: qty 1

## 2020-09-19 MED ORDER — GABAPENTIN 300 MG PO CAPS
300.0000 mg | ORAL_CAPSULE | Freq: Every day | ORAL | Status: DC
  Administered 2020-09-19: 300 mg via ORAL
  Filled 2020-09-19: qty 1

## 2020-09-19 MED ORDER — DOCUSATE SODIUM 100 MG PO CAPS
100.0000 mg | ORAL_CAPSULE | Freq: Two times a day (BID) | ORAL | Status: DC
  Administered 2020-09-19 – 2020-09-20 (×2): 100 mg via ORAL
  Filled 2020-09-19 (×2): qty 1

## 2020-09-19 MED ORDER — METHOCARBAMOL 1000 MG/10ML IJ SOLN
500.0000 mg | Freq: Four times a day (QID) | INTRAVENOUS | Status: DC | PRN
  Filled 2020-09-19: qty 5

## 2020-09-19 MED ORDER — RIVAROXABAN 10 MG PO TABS
10.0000 mg | ORAL_TABLET | Freq: Every day | ORAL | Status: DC
  Administered 2020-09-20: 10 mg via ORAL
  Filled 2020-09-19: qty 1

## 2020-09-19 MED ORDER — MONTELUKAST SODIUM 10 MG PO TABS
10.0000 mg | ORAL_TABLET | Freq: Every day | ORAL | Status: DC
  Administered 2020-09-19 – 2020-09-20 (×2): 10 mg via ORAL
  Filled 2020-09-19 (×2): qty 1

## 2020-09-19 MED ORDER — ORAL CARE MOUTH RINSE: 15.0000 mL | Freq: Once | OROMUCOSAL | Status: AC

## 2020-09-19 MED ORDER — OXYCODONE HCL 5 MG/5ML PO SOLN: 5.0000 mg | Freq: Once | ORAL | Status: DC | PRN

## 2020-09-19 MED ORDER — DEXAMETHASONE SODIUM PHOSPHATE 10 MG/ML IJ SOLN
INTRAMUSCULAR | Status: DC | PRN
  Administered 2020-09-19: 10 mg via INTRAVENOUS

## 2020-09-19 MED ORDER — METOCLOPRAMIDE HCL 5 MG PO TABS: 5.0000 mg | ORAL_TABLET | Freq: Three times a day (TID) | ORAL | Status: DC | PRN

## 2020-09-19 MED ORDER — MIDAZOLAM HCL 2 MG/2ML IJ SOLN
INTRAMUSCULAR | Status: DC | PRN
  Administered 2020-09-19: 2 mg via INTRAVENOUS

## 2020-09-19 MED ORDER — BUPIVACAINE-EPINEPHRINE 0.25% -1:200000 IJ SOLN
INTRAMUSCULAR | Status: DC | PRN
  Administered 2020-09-19: 30 mL

## 2020-09-19 MED ORDER — LIDOCAINE 2% (20 MG/ML) 5 ML SYRINGE
INTRAMUSCULAR | Status: DC | PRN
  Administered 2020-09-19: 60 mg via INTRAVENOUS

## 2020-09-19 MED ORDER — ONDANSETRON HCL 4 MG/2ML IJ SOLN: 4.0000 mg | Freq: Four times a day (QID) | INTRAMUSCULAR | Status: DC | PRN

## 2020-09-19 MED ORDER — ALBUMIN HUMAN 5 % IV SOLN: INTRAVENOUS | Status: DC | PRN

## 2020-09-19 MED ORDER — LIDOCAINE HCL (PF) 2 % IJ SOLN
INTRAMUSCULAR | Status: AC
  Filled 2020-09-19: qty 5

## 2020-09-19 MED ORDER — 0.9 % SODIUM CHLORIDE (POUR BTL) OPTIME
TOPICAL | Status: DC | PRN
  Administered 2020-09-19: 1000 mL

## 2020-09-19 MED ORDER — ACETAMINOPHEN 10 MG/ML IV SOLN
1000.0000 mg | Freq: Once | INTRAVENOUS | Status: DC | PRN
  Administered 2020-09-19: 1000 mg via INTRAVENOUS

## 2020-09-19 MED ORDER — HYDROCODONE-ACETAMINOPHEN 7.5-325 MG PO TABS
1.0000 | ORAL_TABLET | ORAL | Status: DC | PRN
  Filled 2020-09-19: qty 2

## 2020-09-19 MED ORDER — LACTATED RINGERS IV SOLN: INTRAVENOUS | Status: DC

## 2020-09-19 MED ORDER — ROCURONIUM BROMIDE 10 MG/ML (PF) SYRINGE
PREFILLED_SYRINGE | INTRAVENOUS | Status: AC
  Filled 2020-09-19: qty 10

## 2020-09-19 MED ORDER — FENTANYL CITRATE (PF) 250 MCG/5ML IJ SOLN
INTRAMUSCULAR | Status: DC | PRN
  Administered 2020-09-19: 100 ug via INTRAVENOUS
  Administered 2020-09-19 (×3): 50 ug via INTRAVENOUS

## 2020-09-19 MED ORDER — ALBUTEROL SULFATE HFA 108 (90 BASE) MCG/ACT IN AERS: 1.0000 | INHALATION_SPRAY | Freq: Four times a day (QID) | RESPIRATORY_TRACT | Status: DC | PRN

## 2020-09-19 MED ORDER — PROPOFOL 10 MG/ML IV BOLUS
INTRAVENOUS | Status: DC | PRN
  Administered 2020-09-19: 200 mg via INTRAVENOUS

## 2020-09-19 MED ORDER — RANOLAZINE ER 500 MG PO TB12
500.0000 mg | ORAL_TABLET | Freq: Every day | ORAL | Status: DC
  Administered 2020-09-20: 500 mg via ORAL
  Filled 2020-09-19: qty 1

## 2020-09-19 MED ORDER — CEFAZOLIN SODIUM-DEXTROSE 2-4 GM/100ML-% IV SOLN
2.0000 g | INTRAVENOUS | Status: AC
  Administered 2020-09-19: 2 g via INTRAVENOUS
  Filled 2020-09-19: qty 100

## 2020-09-19 MED ORDER — MORPHINE SULFATE (PF) 2 MG/ML IV SOLN: 0.5000 mg | INTRAVENOUS | Status: DC | PRN

## 2020-09-19 MED ORDER — ALBUTEROL SULFATE (2.5 MG/3ML) 0.083% IN NEBU: 2.5000 mg | INHALATION_SOLUTION | Freq: Four times a day (QID) | RESPIRATORY_TRACT | Status: DC | PRN

## 2020-09-19 MED ORDER — NITROGLYCERIN 0.4 MG SL SUBL: 0.4000 mg | SUBLINGUAL_TABLET | SUBLINGUAL | Status: DC | PRN

## 2020-09-19 MED ORDER — SENNA 8.6 MG PO TABS
2.0000 | ORAL_TABLET | Freq: Every day | ORAL | Status: DC
  Administered 2020-09-19: 17.2 mg via ORAL
  Filled 2020-09-19: qty 2

## 2020-09-19 MED ORDER — OXYCODONE HCL 5 MG PO TABS: 5.0000 mg | ORAL_TABLET | Freq: Once | ORAL | Status: DC | PRN

## 2020-09-19 MED ORDER — ACETAMINOPHEN 325 MG PO TABS: 325.0000 mg | ORAL_TABLET | Freq: Four times a day (QID) | ORAL | Status: DC | PRN

## 2020-09-19 MED ORDER — MENTHOL 3 MG MT LOZG: 1.0000 | LOZENGE | OROMUCOSAL | Status: DC | PRN

## 2020-09-19 MED ORDER — POVIDONE-IODINE 10 % EX SWAB
2.0000 "application " | Freq: Once | CUTANEOUS | Status: AC
  Administered 2020-09-19: 2 via TOPICAL

## 2020-09-19 MED ORDER — TIOTROPIUM BROMIDE MONOHYDRATE 2.5 MCG/ACT IN AERS: 1.0000 | INHALATION_SPRAY | Freq: Two times a day (BID) | RESPIRATORY_TRACT | Status: DC

## 2020-09-19 MED ORDER — TAMSULOSIN HCL 0.4 MG PO CAPS
0.4000 mg | ORAL_CAPSULE | Freq: Every day | ORAL | Status: DC
  Administered 2020-09-19: 0.4 mg via ORAL
  Filled 2020-09-19: qty 1

## 2020-09-19 MED ORDER — CYCLOBENZAPRINE HCL 5 MG PO TABS
5.0000 mg | ORAL_TABLET | Freq: Every day | ORAL | Status: DC
  Administered 2020-09-20: 5 mg via ORAL
  Filled 2020-09-19: qty 1

## 2020-09-19 MED ORDER — MIDAZOLAM HCL 2 MG/2ML IJ SOLN
INTRAMUSCULAR | Status: AC
  Filled 2020-09-19: qty 2

## 2020-09-19 MED ORDER — ACETAMINOPHEN 10 MG/ML IV SOLN
1000.0000 mg | Freq: Once | INTRAVENOUS | Status: AC
  Administered 2020-09-19: 1000 mg via INTRAVENOUS
  Filled 2020-09-19: qty 100

## 2020-09-19 MED ORDER — HYDROCORTISONE 10 MG PO TABS
10.0000 mg | ORAL_TABLET | Freq: Every day | ORAL | Status: DC
  Administered 2020-09-19: 10 mg via ORAL
  Filled 2020-09-19: qty 1

## 2020-09-19 MED ORDER — PROMETHAZINE HCL 25 MG/ML IJ SOLN: 6.2500 mg | INTRAMUSCULAR | Status: DC | PRN

## 2020-09-19 MED ORDER — LORATADINE 10 MG PO TABS
10.0000 mg | ORAL_TABLET | Freq: Every day | ORAL | Status: DC
  Administered 2020-09-20: 10 mg via ORAL
  Filled 2020-09-19: qty 1

## 2020-09-19 SURGICAL SUPPLY — 67 items
ADH SKN CLS APL DERMABOND .7 (GAUZE/BANDAGES/DRESSINGS) ×2
APL PRP STRL LF DISP 70% ISPRP (MISCELLANEOUS) ×1
BAG DECANTER FOR FLEXI CONT (MISCELLANEOUS) IMPLANT
BAG SPEC THK2 15X12 ZIP CLS (MISCELLANEOUS)
BAG ZIPLOCK 12X15 (MISCELLANEOUS) IMPLANT
BLADE SURG SZ10 CARB STEEL (BLADE) IMPLANT
CHLORAPREP W/TINT 26 (MISCELLANEOUS) ×3 IMPLANT
COVER PERINEAL POST (MISCELLANEOUS) ×3 IMPLANT
COVER SURGICAL LIGHT HANDLE (MISCELLANEOUS) ×3 IMPLANT
COVER WAND RF STERILE (DRAPES) IMPLANT
CUP ACET PINNACLE SECTR 60MM (Hips) IMPLANT
DECANTER SPIKE VIAL GLASS SM (MISCELLANEOUS) ×3 IMPLANT
DERMABOND ADVANCED (GAUZE/BANDAGES/DRESSINGS) ×4
DERMABOND ADVANCED .7 DNX12 (GAUZE/BANDAGES/DRESSINGS) ×2 IMPLANT
DRAPE IMP U-DRAPE 54X76 (DRAPES) ×3 IMPLANT
DRAPE SHEET LG 3/4 BI-LAMINATE (DRAPES) ×9 IMPLANT
DRAPE STERI IOBAN 125X83 (DRAPES) IMPLANT
DRAPE U-SHAPE 47X51 STRL (DRAPES) ×6 IMPLANT
DRSG AQUACEL AG ADV 3.5X10 (GAUZE/BANDAGES/DRESSINGS) ×3 IMPLANT
ELECT REM PT RETURN 15FT ADLT (MISCELLANEOUS) ×3 IMPLANT
GAUZE SPONGE 4X4 12PLY STRL (GAUZE/BANDAGES/DRESSINGS) ×3 IMPLANT
GLOVE BIO SURGEON STRL SZ8.5 (GLOVE) ×6 IMPLANT
GLOVE BIOGEL M STRL SZ7.5 (GLOVE) ×6 IMPLANT
GLOVE BIOGEL PI IND STRL 8 (GLOVE) ×1 IMPLANT
GLOVE BIOGEL PI IND STRL 8.5 (GLOVE) ×1 IMPLANT
GLOVE BIOGEL PI INDICATOR 8 (GLOVE) ×2
GLOVE BIOGEL PI INDICATOR 8.5 (GLOVE) ×2
GOWN SPEC L3 XXLG W/TWL (GOWN DISPOSABLE) ×3 IMPLANT
GOWN STRL REUS W/ TWL LRG LVL3 (GOWN DISPOSABLE) ×1 IMPLANT
GOWN STRL REUS W/TWL LRG LVL3 (GOWN DISPOSABLE) ×3
HANDPIECE INTERPULSE COAX TIP (DISPOSABLE) ×3
HEAD CERAMIC DELTA 36 PLUS 1.5 (Hips) ×2 IMPLANT
HOLDER FOLEY CATH W/STRAP (MISCELLANEOUS) ×3 IMPLANT
HOOD PEEL AWAY FLYTE STAYCOOL (MISCELLANEOUS) ×12 IMPLANT
JET LAVAGE IRRISEPT WOUND (IRRIGATION / IRRIGATOR) ×3
KIT TURNOVER KIT A (KITS) IMPLANT
LAVAGE JET IRRISEPT WOUND (IRRIGATION / IRRIGATOR) ×1 IMPLANT
LINER PINN ALTRX ACTABR 36X60 (Liner) IMPLANT
LINER PINNACLE ALTRAX ACTABULR (Liner) ×3 IMPLANT
MANIFOLD NEPTUNE II (INSTRUMENTS) ×3 IMPLANT
MARKER SKIN DUAL TIP RULER LAB (MISCELLANEOUS) ×3 IMPLANT
NDL SAFETY ECLIPSE 18X1.5 (NEEDLE) ×1 IMPLANT
NDL SPNL 18GX3.5 QUINCKE PK (NEEDLE) ×1 IMPLANT
NEEDLE HYPO 18GX1.5 SHARP (NEEDLE) ×3
NEEDLE SPNL 18GX3.5 QUINCKE PK (NEEDLE) ×3 IMPLANT
PACK ANTERIOR HIP CUSTOM (KITS) ×3 IMPLANT
PENCIL SMOKE EVACUATOR (MISCELLANEOUS) IMPLANT
PINNSECTOR W/GRIP ACE CUP 60MM (Hips) ×3 IMPLANT
SAW OSC TIP CART 19.5X105X1.3 (SAW) ×3 IMPLANT
SEALER BIPOLAR AQUA 6.0 (INSTRUMENTS) ×3 IMPLANT
SET HNDPC FAN SPRY TIP SCT (DISPOSABLE) ×1 IMPLANT
STAPLER VISISTAT 35W (STAPLE) ×2 IMPLANT
STEM TRI LOC BPS SZ8 W GRIPTON IMPLANT
SUT ETHIBOND NAB CT1 #1 30IN (SUTURE) ×6 IMPLANT
SUT MNCRL AB 3-0 PS2 18 (SUTURE) ×3 IMPLANT
SUT MNCRL AB 4-0 PS2 18 (SUTURE) ×3 IMPLANT
SUT MON AB 2-0 CT1 36 (SUTURE) ×6 IMPLANT
SUT STRATAFIX PDO 1 14 VIOLET (SUTURE) ×3
SUT STRATFX PDO 1 14 VIOLET (SUTURE) ×1
SUT VIC AB 2-0 CT1 27 (SUTURE) ×3
SUT VIC AB 2-0 CT1 TAPERPNT 27 (SUTURE) ×1 IMPLANT
SUTURE STRATFX PDO 1 14 VIOLET (SUTURE) ×1 IMPLANT
SYR 3ML LL SCALE MARK (SYRINGE) ×3 IMPLANT
TRAY FOLEY MTR SLVR 16FR STAT (SET/KITS/TRAYS/PACK) IMPLANT
TRI LOC BPS SZ8 W GRIPTON ×3 IMPLANT
TUBE SUCTION HIGH CAP CLEAR NV (SUCTIONS) ×3 IMPLANT
WATER STERILE IRR 1000ML POUR (IV SOLUTION) ×3 IMPLANT

## 2020-09-19 NOTE — Interval H&P Note (Signed)
History and Physical Interval Note:  09/19/2020 10:34 AM  Matthew Adams  has presented today for surgery, with the diagnosis of avascular necrosis of bone of hip.  The various methods of treatment have been discussed with the patient and family. After consideration of risks, benefits and other options for treatment, the patient has consented to  Procedure(s): TOTAL HIP ARTHROPLASTY ANTERIOR APPROACH (Right) as a surgical intervention.  The patient's history has been reviewed, patient examined, no change in status, stable for surgery.  I have reviewed the patient's chart and labs.  Questions were answered to the patient's satisfaction.    The risks, benefits, and alternatives were discussed with the patient. There are risks associated with the surgery including, but not limited to, problems with anesthesia (death), infection, instability (giving out of the joint), dislocation, differences in leg length/angulation/rotation, fracture of bones, loosening or failure of implants, hematoma (blood accumulation) which may require surgical drainage, blood clots, pulmonary embolism, nerve injury (foot drop and lateral thigh numbness), and blood vessel injury. The patient understands these risks and elects to proceed.    Hilton Cork Idalia Allbritton

## 2020-09-19 NOTE — Progress Notes (Signed)
Patient admitted to taking 1000mg  Tylenol out of his bag, educated him to not take any meds out of his bag. Patient states he understands. Bethann Punches RN

## 2020-09-19 NOTE — Op Note (Signed)
OPERATIVE REPORT  SURGEON: Rod Can, MD   ASSISTANT: Cherlynn June, PA-C.  PREOPERATIVE DIAGNOSIS: Right hip avascular necrosis.   POSTOPERATIVE DIAGNOSIS: Right hip avascular necrosis.   PROCEDURE: Right total hip arthroplasty, anterior approach.   IMPLANTS: DePuy Tri Lock stem, size 8, hi offset. DePuy Pinnacle Cup, size 60 mm. DePuy Altrx liner, size 36 by 60 mm, neutral. DePuy Biolox ceramic head ball, size 36 + 1.5 mm.  ANESTHESIA:  Spinal  ESTIMATED BLOOD LOSS:-250 mL    ANTIBIOTICS: 2g Ancef.  DRAINS: None.  COMPLICATIONS: None.   CONDITION: PACU - hemodynamically stable.   BRIEF CLINICAL NOTE: Matthew Adams is a 59 y.o. male with a long-standing history of Right hip AVN. After failing conservative management, the patient was indicated for total hip arthroplasty. The risks, benefits, and alternatives to the procedure were explained, and the patient elected to proceed.  PROCEDURE IN DETAIL: Surgical site was marked by myself in the pre-op holding area. Once inside the operating room, spinal anesthesia was obtained, and a foley catheter was inserted. The patient was then positioned on the [place] table.  All bony prominences were well padded.  The hip was prepped and draped in the normal sterile surgical fashion.  A time-out was called verifying side and site of surgery. The patient received IV antibiotics within 60 minutes of beginning the procedure.   The direct anterior approach to the hip was performed through the Hueter interval.  Lateral femoral circumflex vessels were treated with the Auqumantys. The anterior capsule was exposed and an inverted T capsulotomy was made. The femoral neck cut was made to the level of the templated cut.  A corkscrew was placed into the head and the head was removed.  The femoral head was found to have eburnated bone. The head was passed to the back table and was measured.   Acetabular exposure was achieved, and the pulvinar and  labrum were excised. Sequential reaming of the acetabulum was then performed up to a size 59 mm reamer. A 60 mm cup was then opened and impacted into place at approximately 40 degrees of abduction and 20 degrees of anteversion. The final polyethylene liner was impacted into place and acetabular osteophytes were removed.    I then gained femoral exposure taking care to protect the abductors and greater trochanter.  This was performed using standard external rotation, extension, and adduction.  The capsule was peeled off the inner aspect of the greater trochanter, taking care to preserve the short external rotators. A cookie cutter was used to enter the femoral canal, and then the femoral canal finder was placed.  Sequential broaching was performed up to a size 8.  Calcar planer was used on the femoral neck remnant.  I placed a hi offset neck and a trial head ball.  The hip was reduced.  Leg lengths and offset were checked fluoroscopically.  The hip was dislocated and trial components were removed.  The final implants were placed, and the hip was reduced.  Fluoroscopy was used to confirm component position and leg lengths.  At 90 degrees of external rotation and full extension, the hip was stable to an anterior directed force.   The wound was copiously irrigated with Irrisept solution and normal saline using pule lavage.  Marcaine solution was injected into the periarticular soft tissue.  The wound was closed in layers using #1 Stratafix for the fascia, 2-0 Vicryl for the subcutaneous fat, 2-0 Monocryl for the deep dermal layer, and staples + Dermabond for  the skin.  Once the glue was fully dried, an Aquacell Ag dressing was applied.  The patient was transported to the recovery room in stable condition.  Sponge, needle, and instrument counts were correct at the end of the case x2.  The patient tolerated the procedure well and there were no known complications.  Please note that a surgical assistant was a medical  necessity for this procedure to perform it in a safe and expeditious manner. Assistant was necessary to provide appropriate retraction of vital neurovascular structures, to prevent femoral fracture, and to allow for anatomic placement of the prosthesis.

## 2020-09-19 NOTE — Anesthesia Postprocedure Evaluation (Signed)
Anesthesia Post Note  Patient: Matthew Adams  Procedure(s) Performed: TOTAL HIP ARTHROPLASTY ANTERIOR APPROACH (Right Hip)     Patient location during evaluation: PACU Anesthesia Type: General Level of consciousness: awake Pain management: pain level controlled Vital Signs Assessment: post-procedure vital signs reviewed and stable Respiratory status: spontaneous breathing, nonlabored ventilation, respiratory function stable and patient connected to nasal cannula oxygen Cardiovascular status: blood pressure returned to baseline and stable Postop Assessment: no apparent nausea or vomiting Anesthetic complications: no   No complications documented.  Last Vitals:  Vitals:   09/19/20 1652 09/19/20 1754  BP: 119/76 119/78  Pulse: 70 78  Resp: 16 18  Temp:    SpO2: 97% 94%    Last Pain:  Vitals:   09/19/20 1528  TempSrc: Oral  PainSc: 2                  Esli Clements P [place]ley [place]

## 2020-09-19 NOTE — Progress Notes (Signed)
Orthopedic Tech Progress Note Patient Details:  Matthew Adams 1961-07-11 [id number]  Ortho Devices Ortho Device/Splint Location: Trapeze bar Ortho Device/Splint Interventions: Application   Post Interventions Patient Tolerated: Well Instructions Provided: Care of device, Adjustment of device   [place] Pink 09/19/2020, 4:56 PM

## 2020-09-19 NOTE — Discharge Instructions (Signed)
°Dr. Chibuike Fleek °Joint Replacement Specialist °[facility] °[address] °[place], [place] [address] °[phone] ° ° °TOTAL HIP REPLACEMENT POSTOPERATIVE DIRECTIONS ° ° ° °Hip Rehabilitation, Guidelines Following Surgery  ° °WEIGHT BEARING °Weight bearing as tolerated with assist device (walker, cane, etc) as directed, use it as long as suggested by your surgeon or therapist, typically at least 4-6 weeks. ° °The results of a hip operation are greatly improved after range of motion and muscle strengthening exercises. Follow all safety measures which are given to protect your hip. If any of these exercises cause increased pain or swelling in your joint, decrease the amount until you are comfortable again. Then slowly increase the exercises. Call your caregiver if you have problems or questions.  ° °HOME CARE INSTRUCTIONS  °Most of the following instructions are designed to prevent the dislocation of your new hip.  °Remove items at home which could result in a fall. This includes throw rugs or furniture in walking pathways.  °Continue medications as instructed at time of discharge. °· You may have some home medications which will be placed on hold until you complete the course of blood thinner medication. °· You may start showering once you are discharged home. Do not remove your dressing. °Do not put on socks or shoes without following the instructions of your caregivers.   °Sit on chairs with arms. Use the chair arms to help push yourself up when arising.  °Arrange for the use of a toilet seat elevator so you are not sitting low.  °· Walk with walker as instructed.  °You may resume a sexual relationship in one month or when given the OK by your caregiver.  °Use walker as long as suggested by your caregivers.  °You may put full weight on your legs and walk as much as is comfortable. °Avoid periods of inactivity such as sitting longer than an hour when not asleep. This helps prevent  blood clots.  °You may return to work once you are cleared by your surgeon.  °Do not drive a car for 6 weeks or until released by your surgeon.  °Do not drive while taking narcotics.  °Wear elastic stockings for two weeks following surgery during the day but you may remove then at night.  °Make sure you keep all of your appointments after your operation with all of your doctors and caregivers. You should call the office at the above phone number and make an appointment for approximately two weeks after the date of your surgery. °Please pick up a stool softener and laxative for home use as long as you are requiring pain medications. °· ICE to the affected hip every three hours for 30 minutes at a time and then as needed for pain and swelling. Continue to use ice on the hip for pain and swelling from surgery. You may notice swelling that will progress down to the foot and ankle.  This is normal after surgery.  Elevate the leg when you are not up walking on it.   °It is important for you to complete the blood thinner medication as prescribed by your doctor. °· Continue to use the breathing machine which will help keep your temperature down.  It is common for your temperature to cycle up and down following surgery, especially at night when you are not up moving around and exerting yourself.  The breathing machine keeps your lungs expanded and your temperature down. ° °RANGE OF MOTION AND STRENGTHENING EXERCISES  °These exercises are   designed to help you keep full movement of your hip joint. Follow your caregiver's or physical therapist's instructions. Perform all exercises about fifteen times, three times per day or as directed. Exercise both hips, even if you have had only one joint replacement. These exercises can be done on a training (exercise) mat, on the floor, on a table or on a bed. Use whatever works the best and is most comfortable for you. Use music or television while you are exercising so that the exercises  are a pleasant break in your day. This will make your life better with the exercises acting as a break in routine you can look forward to.  °Lying on your back, slowly slide your foot toward your buttocks, raising your knee up off the floor. Then slowly slide your foot back down until your leg is straight again.  °Lying on your back spread your legs as far apart as you can without causing discomfort.  °Lying on your side, raise your upper leg and foot straight up from the floor as far as is comfortable. Slowly lower the leg and repeat.  °Lying on your back, tighten up the muscle in the front of your thigh (quadriceps muscles). You can do this by keeping your leg straight and trying to raise your heel off the floor. This helps strengthen the largest muscle supporting your knee.  °Lying on your back, tighten up the muscles of your buttocks both with the legs straight and with the knee bent at a comfortable angle while keeping your heel on the floor.  ° °SKILLED REHAB INSTRUCTIONS: °If the patient is transferred to a skilled rehab facility following release from the hospital, a list of the current medications will be sent to the facility for the patient to continue.  When discharged from the skilled rehab facility, please have the facility set up the patient's Home Health Physical Therapy prior to being released. Also, the skilled facility will be responsible for providing the patient with their medications at time of release from the facility to include their pain medication and their blood thinner medication. If the patient is still at the rehab facility at time of the two week follow up appointment, the skilled rehab facility will also need to assist the patient in arranging follow up appointment in our office and any transportation needs. ° °MAKE SURE YOU:  °Understand these instructions.  °Will watch your condition.  °Will get help right away if you are not doing well or get worse. ° °Pick up stool softner and  laxative for home use following surgery while on pain medications. °Do not remove your dressing. °The dressing is waterproof--it is OK to take showers. °Continue to use ice for pain and swelling after surgery. °Do not use any lotions or creams on the incision until instructed by your surgeon. °Total Hip Protocol. ° ° °

## 2020-09-19 NOTE — Evaluation (Signed)
Physical Therapy Evaluation Patient Details Name: Matthew Adams MRN: [id number] DOB: 03-19-1961 Today's Date: 09/19/2020   History of Present Illness  Patient is 59 y.o. male s/p Rt THA anterior approach on 09/19/20 with PMH significant for MI, OA, COPD, HTN, PE, brain tumor, CABG.     Clinical Impression  Matthew Adams is a 59 y.o. male POD 0 s/p Rt THA. Patient reports independence with mobility at baseline. Patient is now limited by functional impairments (see PT problem list below) and requires min assist for transfers and gait with RW. Patient was able to ambulate ~35 feet with RW and min assist. Patient instructed in exercise to facilitate ROM and circulation. Patient will benefit from continued skilled PT interventions to address impairments and progress towards PLOF. Acute PT will follow to progress mobility and stair training in preparation for safe discharge home.     Follow Up Recommendations Follow surgeon's recommendation for DC plan and follow-up therapies    Equipment Recommendations  Rolling walker with 5" wheels    Recommendations for Other Services       Precautions / Restrictions Precautions Precautions: Fall Restrictions Weight Bearing Restrictions: No Other Position/Activity Restrictions: WBAT      Mobility  Bed Mobility Overal bed mobility: Needs Assistance Bed Mobility: Supine to Sit     Supine to sit: HOB elevated;Min assist     General bed mobility comments: cues to use bed rail and assist for Rt LE to move to EOB.    Transfers Overall transfer level: Needs assistance Equipment used: Rolling walker (2 wheeled) Transfers: Sit to/from Stand Sit to Stand: Min assist;From elevated surface         General transfer comment: cues for hand placement and technique with RW, min assist for power up. pt with wide stance for balance.  Ambulation/Gait Ambulation/Gait assistance: Min assist Gait Distance (Feet): 35 Feet Assistive device: Rolling  walker (2 wheeled) Gait Pattern/deviations: Step-to pattern;Decreased stride length;Decreased weight shift to right;Wide base of support Gait velocity: decr   General Gait Details: VC's for proximity to RW and step pattern. min assist to steady. pt with very wide stance for balance.   Stairs            Wheelchair Mobility    Modified Rankin (Stroke Patients Only)       Balance Overall balance assessment: Needs assistance Sitting-balance support: Feet supported Sitting balance-Leahy Scale: Good     Standing balance support: During functional activity;Bilateral upper extremity supported Standing balance-Leahy Scale: Fair                               Pertinent Vitals/Pain Pain Assessment: 0-10 Pain Score: 8  Pain Location: Rt hip Pain Descriptors / Indicators: Aching;Discomfort Pain Intervention(s): Limited activity within patient's tolerance;Monitored during session;Premedicated before session;Repositioned    Home Living Family/patient expects to be discharged to:: Private residence Living Arrangements: Children Available Help at Discharge: Family Type of Home: Apartment Home Access: Level entry     Home Layout: One level Home Equipment: Cane - single point;Shower seat;Bedside commode;Grab bars - tub/shower;Grab bars - toilet Additional Comments: pt takes care of his adult daughter. His other daughter and son-in-law will be staying for a week to hlep.    Prior Function Level of Independence: Independent               Hand Dominance   Dominant Hand: Right    Extremity/Trunk Assessment   Upper Extremity  Assessment Upper Extremity Assessment: Overall WFL for tasks assessed    Lower Extremity Assessment Lower Extremity Assessment: Overall WFL for tasks assessed    Cervical / Trunk Assessment Cervical / Trunk Assessment: Normal  Communication   Communication: No difficulties  Cognition Arousal/Alertness: Awake/alert Behavior During  Therapy: WFL for tasks assessed/performed Overall Cognitive Status: Within Functional Limits for tasks assessed                                        General Comments      Exercises Total Joint Exercises Ankle Circles/Pumps: AROM;Both;20 reps;Seated   Assessment/Plan    PT Assessment Patient needs continued PT services  PT Problem List Decreased strength;Decreased range of motion;Decreased activity tolerance;Decreased balance;Decreased mobility;Decreased knowledge of use of DME       PT Treatment Interventions DME instruction;Gait training;Functional mobility training;Stair training;Therapeutic activities;Therapeutic exercise;Balance training;Patient/family education    PT Goals (Current goals can be found in the Care Plan section)  Acute Rehab PT Goals Patient Stated Goal: regain independence PT Goal Formulation: With patient Time For Goal Achievement: 09/26/20 Potential to Achieve Goals: Good    Frequency 7X/week   Barriers to discharge        Co-evaluation               AM-PAC PT "6 Clicks" Mobility  Outcome Measure Help needed turning from your back to your side while in a flat bed without using bedrails?: A Little Help needed moving from lying on your back to sitting on the side of a flat bed without using bedrails?: A Little Help needed moving to and from a bed to a chair (including a wheelchair)?: A Little Help needed standing up from a chair using your arms (e.g., wheelchair or bedside chair)?: A Little Help needed to walk in hospital room?: A Little Help needed climbing 3-5 steps with a railing? : A Little 6 Click Score: 18    End of Session Equipment Utilized During Treatment: Gait belt Activity Tolerance: Patient tolerated treatment well Patient left: in chair;with call bell/phone within reach;with chair alarm set;with family/visitor present Nurse Communication: Mobility status PT Visit Diagnosis: Muscle weakness (generalized)  (M62.81);Difficulty in walking, not elsewhere classified (R26.2)    Time: 0569-7948 PT Time Calculation (min) (ACUTE ONLY): 23 min   Charges:   PT Evaluation $PT Eval Low Complexity: 1 Low PT Treatments $Gait Training: 8-22 mins        Verner Mould, DPT Acute Rehabilitation Services  Office [phone] Pager [phone]  09/19/2020 5:56 PM

## 2020-09-19 NOTE — Plan of Care (Signed)
  Problem: Education: Goal: Knowledge of General Education information will improve Description: Including pain rating scale, medication(s)/side effects and non-pharmacologic comfort measures Outcome: Progressing   Problem: Activity: Goal: Risk for activity intolerance will decrease Outcome: Progressing   Problem: Nutrition: Goal: Adequate nutrition will be maintained Outcome: Progressing   Problem: Elimination: Goal: Will not experience complications related to bowel motility Outcome: Progressing   Problem: Pain Managment: Goal: General experience of comfort will improve Outcome: Progressing   Problem: Activity: Goal: Ability to avoid complications of mobility impairment will improve Outcome: Progressing Goal: Ability to tolerate increased activity will improve Outcome: Progressing   Problem: Clinical Measurements: Goal: Postoperative complications will be avoided or minimized Outcome: Progressing   Problem: Pain Management: Goal: Pain level will decrease with appropriate interventions Outcome: Progressing   Problem: Skin Integrity: Goal: Will show signs of wound healing Outcome: Progressing

## 2020-09-19 NOTE — Anesthesia Procedure Notes (Signed)
Procedure Name: Intubation Date/Time: 09/19/2020 12:12 PM Performed by: Michele Rockers, CRNA Pre-anesthesia Checklist: Patient identified, Patient being monitored, Timeout performed, Emergency Drugs available and Suction available Patient Re-evaluated:Patient Re-evaluated prior to induction Oxygen Delivery Method: Circle System Utilized Preoxygenation: Pre-oxygenation with 100% oxygen Induction Type: IV induction Ventilation: Mask ventilation without difficulty Laryngoscope Size: Miller and 2 Grade View: Grade I Tube type: Oral Tube size: 7.5 mm Number of attempts: 1 Airway Equipment and Method: Stylet Placement Confirmation: ETT inserted through vocal cords under direct vision,  positive ETCO2 and breath sounds checked- equal and bilateral Secured at: 22 cm Tube secured with: Tape Dental Injury: Teeth and Oropharynx as per pre-operative assessment

## 2020-09-19 NOTE — Transfer of Care (Signed)
Immediate Anesthesia Transfer of Care Note  Patient: Matthew Adams  Procedure(s) Performed: TOTAL HIP ARTHROPLASTY ANTERIOR APPROACH (Right Hip)  Patient Location: PACU  Anesthesia Type:General  Level of Consciousness: awake, alert , patient cooperative and responds to stimulation  Airway & Oxygen Therapy: Patient Spontanous Breathing and Patient connected to face mask oxygen  Post-op Assessment: Report given to RN and Post -op Vital signs reviewed and stable  Post vital signs: Reviewed and stable  Last Vitals:  Vitals Value Taken Time  BP 120/81 09/19/20 1403  Temp    Pulse 71 09/19/20 1406  Resp 22 09/19/20 1406  SpO2 100 % 09/19/20 1406  Vitals shown include unvalidated device data.  Last Pain:  Vitals:   09/19/20 0908  TempSrc: Oral  PainSc:          Complications: No complications documented.

## 2020-09-20 ENCOUNTER — Encounter (HOSPITAL_COMMUNITY): Payer: Self-pay | Admitting: Orthopedic Surgery

## 2020-09-20 ENCOUNTER — Other Ambulatory Visit (HOSPITAL_COMMUNITY): Payer: Self-pay | Admitting: Student

## 2020-09-20 LAB — CBC
HCT: 37.3 % — ABNORMAL LOW (ref 39.0–52.0)
Hemoglobin: 11.7 g/dL — ABNORMAL LOW (ref 13.0–17.0)
MCH: 27.9 pg (ref 26.0–34.0)
MCHC: 31.4 g/dL (ref 30.0–36.0)
MCV: 89 fL (ref 80.0–100.0)
Platelets: 214 10*3/uL (ref 150–400)
RBC: 4.19 MIL/uL — ABNORMAL LOW (ref 4.22–5.81)
RDW: 14.6 % (ref 11.5–15.5)
WBC: 17.5 10*3/uL — ABNORMAL HIGH (ref 4.0–10.5)
nRBC: 0 % (ref 0.0–0.2)

## 2020-09-20 LAB — BASIC METABOLIC PANEL
Anion gap: 8 (ref 5–15)
BUN: 19 mg/dL (ref 8–23)
CO2: 24 mmol/L (ref 22–32)
Calcium: 8.3 mg/dL — ABNORMAL LOW (ref 8.9–10.3)
Chloride: 104 mmol/L (ref 98–111)
Creatinine, Ser: 0.97 mg/dL (ref 0.61–1.24)
GFR, Estimated: >60 mL/min (ref >60)
Glucose, Bld: 116 mg/dL — ABNORMAL HIGH (ref 70–99)
Potassium: 4.5 mmol/L (ref 3.5–5.1)
Sodium: 136 mmol/L (ref 135–145)

## 2020-09-20 MED ORDER — SENNA 8.6 MG PO TABS: 2.0000 | ORAL_TABLET | Freq: Every day | ORAL | 0 refills | Status: DC

## 2020-09-20 MED ORDER — HYDROCODONE-ACETAMINOPHEN 5-325 MG PO TABS
1.0000 | ORAL_TABLET | ORAL | 0 refills | Status: DC | PRN
Start: 2020-09-20 — End: 2020-09-20

## 2020-09-20 MED ORDER — DOCUSATE SODIUM 100 MG PO CAPS: 100.0000 mg | ORAL_CAPSULE | Freq: Two times a day (BID) | ORAL | 0 refills | Status: AC

## 2020-09-20 MED ORDER — ONDANSETRON HCL 4 MG PO TABS: 4.0000 mg | ORAL_TABLET | Freq: Four times a day (QID) | ORAL | 0 refills | Status: DC | PRN

## 2020-09-20 MED FILL — ONDANSETRON HCL 4 MG TABS: 4 | 30 days supply | Qty: 20 | Fill #0

## 2020-09-20 MED FILL — HYDROCODON-APAP 5-325: 5-325 | 7 days supply | Qty: 40 | Fill #0

## 2020-09-20 NOTE — Plan of Care (Signed)

## 2020-09-20 NOTE — Progress Notes (Signed)
Physical Therapy Treatment Patient Details Name: Matthew Adams MRN: [id number] DOB: August 12, 1961 Today's Date: 09/20/2020    History of Present Illness Patient is 59 y.o. male s/p Rt THA anterior approach on 09/19/20 with PMH significant for MI, OA, COPD, HTN, PE, brain tumor, CABG.     PT Comments    Pt ambulated 250' with RW, no loss of balance. He demonstrates good understanding of HEP. He is ready to DC home from PT standpoint.   Follow Up Recommendations  Follow surgeon's recommendation for DC plan and follow-up therapies     Equipment Recommendations  Rolling walker with 5" wheels    Recommendations for Other Services       Precautions / Restrictions Precautions Precautions: Fall Restrictions Other Position/Activity Restrictions: WBAT    Mobility  Bed Mobility Overal bed mobility: Modified Independent       Supine to sit: HOB elevated;Modified independent (Device/Increase time)     General bed mobility comments: used rail  Transfers Overall transfer level: Needs assistance Equipment used: Rolling walker (2 wheeled) Transfers: Sit to/from Stand Sit to Stand: Supervision         General transfer comment: VCs hand placement  Ambulation/Gait Ambulation/Gait assistance: Supervision Gait Distance (Feet): 250 Feet Assistive device: Rolling walker (2 wheeled) Gait Pattern/deviations: Step-to pattern;Step-through pattern;Decreased stride length;Trunk flexed Gait velocity: decr   General Gait Details: VC's for proximity to RW, posture, and step pattern   Stairs             Wheelchair Mobility    Modified Rankin (Stroke Patients Only)       Balance             Standing balance-Leahy Scale: Fair                              Cognition Arousal/Alertness: Awake/alert Behavior During Therapy: WFL for tasks assessed/performed Overall Cognitive Status: Within Functional Limits for tasks assessed                                         Exercises Total Joint Exercises Ankle Circles/Pumps: AROM;Both;20 reps;Seated Quad Sets: AROM;Right;10 reps;Supine Short Arc Quad: AROM;Right;10 reps;Supine Heel Slides: AAROM;Right;10 reps;Supine Hip ABduction/ADduction: AAROM;Right;10 reps;Supine Long Arc Quad: AROM;Right;10 reps;Seated    General Comments        Pertinent Vitals/Pain Pain Score: 2  Pain Location: Rt hip Pain Descriptors / Indicators: Aching;Discomfort Pain Intervention(s): Limited activity within patient's tolerance;Monitored during session;Premedicated before session;Ice applied    Home Living                      Prior Function            PT Goals (current goals can now be found in the care plan section) Acute Rehab PT Goals Patient Stated Goal: regain independence PT Goal Formulation: With patient Time For Goal Achievement: 09/26/20 Potential to Achieve Goals: Good Progress towards PT goals: Goals met/education completed, patient discharged from PT    Frequency    7X/week      PT Plan Current plan remains appropriate    Co-evaluation              AM-PAC PT "6 Clicks" Mobility   Outcome Measure  Help needed turning from your back to your side while in a flat bed without using bedrails?: None Help  needed moving from lying on your back to sitting on the side of a flat bed without using bedrails?: A Little Help needed moving to and from a bed to a chair (including a wheelchair)?: None Help needed standing up from a chair using your arms (e.g., wheelchair or bedside chair)?: None Help needed to walk in hospital room?: None Help needed climbing 3-5 steps with a railing? : A Little 6 Click Score: 22    End of Session Equipment Utilized During Treatment: Gait belt Activity Tolerance: Patient tolerated treatment well Patient left: with call bell/phone within reach (on commode) Nurse Communication: Mobility status;Other (comment) (pt on commode) PT Visit  Diagnosis: Muscle weakness (generalized) (M62.81);Difficulty in walking, not elsewhere classified (R26.2)     Time: 0045-9977 PT Time Calculation (min) (ACUTE ONLY): 19 min  Charges:  $Gait Training: 8-22 mins                     Blondell Reveal Kistler PT 09/20/2020  Acute Rehabilitation Services Pager [phone] Office [phone]

## 2020-09-20 NOTE — Progress Notes (Signed)
    Subjective:  Patient reports pain as mild.  Denies N/V/CP/SOB. Ready to go home  Objective:   VITALS:   Vitals:   09/20/20 0130 09/20/20 0547 09/20/20 0754 09/20/20 1012  BP: 118/75 102/67  112/66  Pulse: 68 78  68  Resp: 18 16  17   Temp: 97.7 F (36.5 C) 98.9 F (37.2 C)  97.8 F (36.6 C)  TempSrc: Oral Oral    SpO2: 93% 95% 95% 94%  Weight:      Height:        NAD ABD soft Sensation intact distally Intact pulses distally Dorsiflexion/Plantar flexion intact Incision: dressing C/D/I Compartment soft   Lab Results  Component Value Date   WBC 17.5 (H) 09/20/2020   HGB 11.7 (L) 09/20/2020   HCT 37.3 (L) 09/20/2020   MCV 89.0 09/20/2020   PLT 214 09/20/2020   BMET    Component Value Date/Time   NA 136 09/20/2020 0338   K 4.5 09/20/2020 0338   CL 104 09/20/2020 0338   CO2 24 09/20/2020 0338   GLUCOSE 116 (H) 09/20/2020 0338   BUN 19 09/20/2020 0338   CREATININE 0.97 09/20/2020 0338   CALCIUM 8.3 (L) 09/20/2020 0338   GFRNONAA >60 09/20/2020 0338     Assessment/Plan: 1 Day Post-Op   Principal Problem:   Avascular necrosis of hip, right (HCC)   WBAT with walker DVT ppx: Aspirin, SCDs, TEDS PO pain control PT/OT Dispo: D/C home with HEP   Hilton Cork Zabella Wease 09/20/2020, 10:44 AM   Rod Can, MD [phone] [place] [facility]  [place] [address], [address], [place], [place] [address] Phone: [phone] [email or web address] Facebook  [employer]

## 2020-09-20 NOTE — Discharge Summary (Signed)
Physician Discharge Summary  Patient ID: Matthew Adams MRN: [id number] DOB/AGE: 61-16-1962 61 y.o.  Admit date: 09/19/2020 Discharge date: 09/20/2020  Admission Diagnoses:  Avascular necrosis of hip, right [facility])  Discharge Diagnoses:  Principal Problem:   Avascular necrosis of hip, right [facility])   Past Medical History:  Diagnosis Date  . Arthritis    hips  . 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 COPD (chronic obstructive pulmonary disease) ([place])   . Coronary artery disease   . Dyspnea   . Heart disease    Coronary stents placed 2006  . History of appendectomy   . Hypercholesterolemia   . Hypertension   . Myocardial infarction [facility])    pt denies  . Pneumonia 07/2020  . Pulmonary embolism ([place])   . Sleep apnea     Surgeries: Procedure(s): TOTAL HIP ARTHROPLASTY ANTERIOR APPROACH on 09/19/2020   Consultants (if any):   Discharged Condition: Improved  Hospital Course: Matthew Adams is an 59 y.o. male who was admitted 09/19/2020 with a diagnosis of Avascular necrosis of hip, right ([place]) and went to the operating room on 09/19/2020 and underwent the above named procedures.    He was given perioperative antibiotics:  Anti-infectives (From admission, onward)   Start     Dose/Rate Route Frequency Ordered Stop   09/19/20 1800  ceFAZolin (ANCEF) IVPB 2g/100 mL premix        2 g 200 mL/hr over 30 Minutes Intravenous Every 6 hours 09/19/20 1532 09/20/20 0125   09/19/20 0845  ceFAZolin (ANCEF) IVPB 2g/100 mL premix        2 g 200 mL/hr over 30 Minutes Intravenous On call to O.R. 09/19/20 0831 09/19/20 1155    .  He was given sequential compression devices, early ambulation, and Xarelto for DVT prophylaxis.  He benefited maximally from the hospital stay and there were no complications.    Recent vital signs:  Vitals:   09/20/20 0754 09/20/20 1012  BP:  112/66  Pulse:  68  Resp:  17  Temp:  97.8 F (36.6 C)  SpO2: 95% 94%     Recent laboratory studies:  Lab Results  Component Value Date   HGB 11.7 (L) 09/20/2020   HGB 14.4 09/17/2020   Lab Results  Component Value Date   WBC 17.5 (H) 09/20/2020   PLT 214 09/20/2020   Lab Results  Component Value Date   INR 1.0 09/17/2020   Lab Results  Component Value Date   NA 136 09/20/2020   K 4.5 09/20/2020   CL 104 09/20/2020   CO2 24 09/20/2020   BUN 19 09/20/2020   CREATININE 0.97 09/20/2020   GLUCOSE 116 (H) 09/20/2020    Discharge Medications:   Allergies as of 09/20/2020   No Known Allergies     Medication List    TAKE these medications   albuterol (2.5 MG/3ML) 0.083% nebulizer solution Commonly known as: PROVENTIL Take 2.5 mg by nebulization every 6 (six) hours as needed for wheezing or shortness of breath.   albuterol 108 (90 Base) MCG/ACT inhaler Commonly known as: VENTOLIN HFA Inhale 1-2 puffs into the lungs every 6 (six) hours as needed for wheezing or shortness of breath.   cetirizine 10 MG tablet Commonly known as: ZYRTEC Take 10 mg by mouth daily.   cyclobenzaprine 5 MG tablet Commonly known as: FLEXERIL Take 5 mg by mouth daily.   docusate sodium 100 MG capsule Commonly known as: COLACE  Take 1 capsule (100 mg total) by mouth 2 (two) times daily.   Fluticasone-Salmeterol 500-50 MCG/DOSE Aepb Commonly known as: ADVAIR Inhale 1 puff into the lungs 2 (two) times daily.   gabapentin 300 MG capsule Commonly known as: NEURONTIN Take 300 mg by mouth at bedtime.   HYDROcodone-acetaminophen 5-325 MG tablet Commonly known as: NORCO/VICODIN Take 1-2 tablets by mouth every 4 (four) hours as needed for up to 7 days for moderate pain (pain score 4-6).   hydrocortisone 10 MG tablet Commonly known as: CORTEF Take 10 mg by mouth at bedtime.   levothyroxine 75 MCG tablet Commonly known as: SYNTHROID Take 75 mcg by mouth daily before breakfast.   metoprolol tartrate 25 MG tablet Commonly known as: LOPRESSOR Take 12.5-25 mg by  mouth See admin instructions. Take 1 tablet (25 mg) by mouth in the morning & take 0.5 tablet (12.5 mg) by mouth at night.   montelukast 10 MG tablet Commonly known as: SINGULAIR Take 10 mg by mouth daily.   NEOMYCIN-POLYMYXIN-HYDROCORTISONE 1 % Soln OTIC solution Commonly known as: CORTISPORIN Place 3 drops into both ears daily as needed.   nitroGLYCERIN 0.4 MG SL tablet Commonly known as: NITROSTAT Place 0.4 mg under the tongue every 5 (five) minutes x 3 doses as needed for chest pain.   ondansetron 4 MG tablet Commonly known as: ZOFRAN Take 1 tablet (4 mg total) by mouth every 6 (six) hours as needed for nausea.   pantoprazole 40 MG tablet Commonly known as: PROTONIX Take 40 mg by mouth at bedtime.   ranolazine 500 MG 12 hr tablet Commonly known as: RANEXA Take 500 mg by mouth daily.   senna 8.6 MG Tabs tablet Commonly known as: SENOKOT Take 2 tablets (17.2 mg total) by mouth at bedtime.   sildenafil 20 MG tablet Commonly known as: REVATIO Take 20-100 mg by mouth daily as needed for erectile dysfunction.   simvastatin 40 MG tablet Commonly known as: ZOCOR Take 40 mg by mouth daily.   Spiriva Respimat 2.5 MCG/ACT Aers Generic drug: Tiotropium Bromide Monohydrate Inhale 1 puff into the lungs in the morning and at bedtime.   tamsulosin 0.4 MG Caps capsule Commonly known as: FLOMAX Take 0.4 mg by mouth at bedtime.   Testosterone 20.25 MG/ACT (1.62%) Gel Apply 3 Pump topically at bedtime. AFTER SHOWERING   Xarelto 15 MG Tabs tablet Generic drug: Rivaroxaban Take 15 mg by mouth daily after supper.       Diagnostic Studies: DG Pelvis Portable  Result Date: 09/19/2020 CLINICAL DATA:  Postoperative, status post right hip total arthroplasty EXAM: PORTABLE PELVIS 1-2 VIEWS COMPARISON:  None. FINDINGS: Status post right hip total arthroplasty with expected overlying postoperative changes. No evidence of component malpositioning or perihardware fracture. Moderate to  severe arthrosis of the included left hip. IMPRESSION: 1. Status post right hip total arthroplasty with expected overlying postoperative changes. No evidence of component malpositioning or perihardware fracture. 2. Moderate to severe arthrosis of the included left hip. Electronically Signed   By: Eddie Candle M.D.   On: 09/19/2020 15:05   DG C-Arm 1-60 Min-No Report  Result Date: 09/19/2020 Fluoroscopy was utilized by the requesting physician.  No radiographic interpretation.   DG HIP OPERATIVE UNILAT W OR W/O PELVIS RIGHT  Result Date: 09/19/2020 CLINICAL DATA:  Intraoperative right total hip arthroplasty. EXAM: OPERATIVE RIGHT HIP (WITH PELVIS IF PERFORMED) 2 VIEWS TECHNIQUE: Fluoroscopic spot image(s) were submitted for interpretation post-operatively. COMPARISON:  Radiographs 07/16/2020 FINDINGS: C-arm fluoroscopy provided in the operating room.  6 seconds fluoroscopy time. 0.9352 mGy. AP spot fluoroscopic images of the right hip and lower pelvis are submitted. These demonstrate interval right total hip arthroplasty. The hardware appears well positioned. No evidence of acute fracture or dislocation. Moderate degenerative changes are noted at the left hip. IMPRESSION: No demonstrated complication following right total hip arthroplasty. Electronically Signed   By: Richardean Sale M.D.   On: 09/19/2020 13:53    Disposition: Discharge disposition: 01-Home or Self Care       Discharge Instructions    Call MD / Call 911   Complete by: As directed    If you experience chest pain or shortness of breath, CALL 911 and be transported to the hospital emergency room.  If you develope a fever above 101 F, pus (white drainage) or increased drainage or redness at the wound, or calf pain, call your surgeon's office.   Constipation Prevention   Complete by: As directed    Drink plenty of fluids.  Prune juice may be helpful.  You may use a stool softener, such as Colace (over the counter) 100 mg twice a day.   Use MiraLax (over the counter) for constipation as needed.   Diet - low sodium heart healthy   Complete by: As directed    Driving restrictions   Complete by: As directed    No driving for 6 weeks   Increase activity slowly as tolerated   Complete by: As directed    Lifting restrictions   Complete by: As directed    No lifting for 6 weeks   TED hose   Complete by: As directed    Use stockings (TED hose) for 6 weeks on both leg(s).  You may remove them at night for sleeping.    Dental Antibiotics:  In most cases prophylactic antibiotics for Dental procdeures after total joint surgery are not necessary.  Exceptions are as follows:  1. History of prior total joint infection  2. Severely immunocompromised (Organ Transplant, cancer chemotherapy, Rheumatoid biologic meds such as [place])  3. Poorly controlled diabetes (A1C &gt; 8.0, blood glucose over 200)  If you have one of these conditions, contact your surgeon for an antibiotic prescription, prior to your dental procedure.   Follow-up Information    Swinteck, Aaron Edelman, MD. Schedule an appointment as soon as possible for a visit in 2 weeks.   Specialty: Orthopedic Surgery Why: For wound re-check, For suture removal Contact information: [address] [place] [address] [phone]                Signed: Dorothyann Peng 09/20/2020, 10:42 AM

## 2020-09-20 NOTE — TOC Transition Note (Signed)
Transition of Care [facility]) - CM/SW Discharge Note   Patient Details  Name: [name] MRN: [id number] Date of Birth: 1961-07-20  Transition of Care [facility]) CM/SW Contact:  Lia Hopping, [place] Phone Number: 09/20/2020, 11:14 AM   Clinical Narrative:    Therapy Plan: HEP RW delivered to the patient bedside by [employer]   Final next level of care: Home/Self Care Barriers to Discharge: Barriers Resolved   Patient Goals and CMS Choice        Discharge Placement                       Discharge Plan and Services                DME Arranged: [place] rolling DME Agency: [employer] Date DME Agency Contacted: 09/20/20 Time DME Agency Contacted: [phone] Representative spoke with at DME Agency: [place] ([place]) Interventions     Readmission Risk Interventions No flowsheet data found.

## 2020-09-20 NOTE — Plan of Care (Signed)
  Problem: Education: Goal: Knowledge of General Education information will improve Description: Including pain rating scale, medication(s)/side effects and non-pharmacologic comfort measures Outcome: Progressing   Problem: Activity: Goal: Risk for activity intolerance will decrease Outcome: Progressing   Problem: Nutrition: Goal: Adequate nutrition will be maintained Outcome: Progressing   

## 2020-10-02 DIAGNOSIS — Z96641 Presence of right artificial hip joint: Secondary | ICD-10-CM | POA: Diagnosis not present

## 2020-10-02 DIAGNOSIS — Z471 Aftercare following joint replacement surgery: Secondary | ICD-10-CM | POA: Diagnosis not present

## 2020-10-15 DIAGNOSIS — E038 Other specified hypothyroidism: Secondary | ICD-10-CM | POA: Diagnosis not present

## 2020-10-24 DIAGNOSIS — E2749 Other adrenocortical insufficiency: Secondary | ICD-10-CM | POA: Diagnosis not present

## 2020-10-24 DIAGNOSIS — E291 Testicular hypofunction: Secondary | ICD-10-CM | POA: Diagnosis not present

## 2020-10-24 DIAGNOSIS — E893 Postprocedural hypopituitarism: Secondary | ICD-10-CM | POA: Diagnosis not present

## 2020-10-24 DIAGNOSIS — E038 Other specified hypothyroidism: Secondary | ICD-10-CM | POA: Diagnosis not present

## 2020-11-05 DIAGNOSIS — Z96641 Presence of right artificial hip joint: Secondary | ICD-10-CM | POA: Diagnosis not present

## 2020-11-05 DIAGNOSIS — Z471 Aftercare following joint replacement surgery: Secondary | ICD-10-CM | POA: Diagnosis not present

## 2020-11-05 DIAGNOSIS — M25511 Pain in right shoulder: Secondary | ICD-10-CM | POA: Diagnosis not present

## 2020-11-12 DIAGNOSIS — J449 Chronic obstructive pulmonary disease, unspecified: Secondary | ICD-10-CM | POA: Diagnosis not present

## 2020-11-12 DIAGNOSIS — R918 Other nonspecific abnormal finding of lung field: Secondary | ICD-10-CM | POA: Diagnosis not present

## 2020-11-12 DIAGNOSIS — K219 Gastro-esophageal reflux disease without esophagitis: Secondary | ICD-10-CM | POA: Diagnosis not present

## 2020-11-12 DIAGNOSIS — G4733 Obstructive sleep apnea (adult) (pediatric): Secondary | ICD-10-CM | POA: Diagnosis not present

## 2020-11-19 DIAGNOSIS — R972 Elevated prostate specific antigen [PSA]: Secondary | ICD-10-CM | POA: Diagnosis not present

## 2020-11-19 DIAGNOSIS — E291 Testicular hypofunction: Secondary | ICD-10-CM | POA: Diagnosis not present

## 2020-11-19 DIAGNOSIS — N401 Enlarged prostate with lower urinary tract symptoms: Secondary | ICD-10-CM | POA: Diagnosis not present

## 2020-11-26 DIAGNOSIS — R918 Other nonspecific abnormal finding of lung field: Secondary | ICD-10-CM | POA: Diagnosis not present

## 2020-11-26 DIAGNOSIS — G4733 Obstructive sleep apnea (adult) (pediatric): Secondary | ICD-10-CM | POA: Diagnosis not present

## 2020-11-26 DIAGNOSIS — J449 Chronic obstructive pulmonary disease, unspecified: Secondary | ICD-10-CM | POA: Diagnosis not present

## 2020-11-26 DIAGNOSIS — K219 Gastro-esophageal reflux disease without esophagitis: Secondary | ICD-10-CM | POA: Diagnosis not present

## 2020-11-28 DIAGNOSIS — R051 Acute cough: Secondary | ICD-10-CM | POA: Diagnosis not present

## 2020-12-04 DIAGNOSIS — M19011 Primary osteoarthritis, right shoulder: Secondary | ICD-10-CM | POA: Diagnosis not present

## 2020-12-07 ENCOUNTER — Other Ambulatory Visit (HOSPITAL_COMMUNITY): Payer: Self-pay | Admitting: Family Medicine

## 2020-12-07 DIAGNOSIS — R972 Elevated prostate specific antigen [PSA]: Secondary | ICD-10-CM

## 2020-12-21 ENCOUNTER — Ambulatory Visit (HOSPITAL_COMMUNITY): Admission: RE | Admit: 2020-12-21 | Payer: PPO | Source: Ambulatory Visit

## 2020-12-21 ENCOUNTER — Encounter (HOSPITAL_COMMUNITY): Payer: Self-pay

## 2020-12-31 DIAGNOSIS — J449 Chronic obstructive pulmonary disease, unspecified: Secondary | ICD-10-CM | POA: Diagnosis not present

## 2020-12-31 DIAGNOSIS — R918 Other nonspecific abnormal finding of lung field: Secondary | ICD-10-CM | POA: Diagnosis not present

## 2020-12-31 DIAGNOSIS — G4733 Obstructive sleep apnea (adult) (pediatric): Secondary | ICD-10-CM | POA: Diagnosis not present

## 2020-12-31 DIAGNOSIS — K219 Gastro-esophageal reflux disease without esophagitis: Secondary | ICD-10-CM | POA: Diagnosis not present

## 2021-01-15 DIAGNOSIS — M19011 Primary osteoarthritis, right shoulder: Secondary | ICD-10-CM | POA: Diagnosis not present

## 2021-01-30 DIAGNOSIS — E291 Testicular hypofunction: Secondary | ICD-10-CM | POA: Diagnosis not present

## 2021-01-30 DIAGNOSIS — J309 Allergic rhinitis, unspecified: Secondary | ICD-10-CM | POA: Diagnosis not present

## 2021-01-30 DIAGNOSIS — Z87891 Personal history of nicotine dependence: Secondary | ICD-10-CM | POA: Diagnosis not present

## 2021-01-30 DIAGNOSIS — E039 Hypothyroidism, unspecified: Secondary | ICD-10-CM | POA: Diagnosis not present

## 2021-01-30 DIAGNOSIS — R739 Hyperglycemia, unspecified: Secondary | ICD-10-CM | POA: Diagnosis not present

## 2021-01-30 DIAGNOSIS — Z79899 Other long term (current) drug therapy: Secondary | ICD-10-CM | POA: Diagnosis not present

## 2021-01-30 DIAGNOSIS — E559 Vitamin D deficiency, unspecified: Secondary | ICD-10-CM | POA: Diagnosis not present

## 2021-01-30 DIAGNOSIS — E785 Hyperlipidemia, unspecified: Secondary | ICD-10-CM | POA: Diagnosis not present

## 2021-01-30 DIAGNOSIS — Z6828 Body mass index (BMI) 28.0-28.9, adult: Secondary | ICD-10-CM | POA: Diagnosis not present

## 2021-01-30 DIAGNOSIS — Z86711 Personal history of pulmonary embolism: Secondary | ICD-10-CM | POA: Diagnosis not present

## 2021-01-30 DIAGNOSIS — E237 Disorder of pituitary gland, unspecified: Secondary | ICD-10-CM | POA: Diagnosis not present

## 2021-01-30 DIAGNOSIS — I119 Hypertensive heart disease without heart failure: Secondary | ICD-10-CM | POA: Diagnosis not present

## 2021-02-21 DIAGNOSIS — E038 Other specified hypothyroidism: Secondary | ICD-10-CM | POA: Diagnosis not present

## 2021-02-21 DIAGNOSIS — E2749 Other adrenocortical insufficiency: Secondary | ICD-10-CM | POA: Diagnosis not present

## 2021-02-21 DIAGNOSIS — E291 Testicular hypofunction: Secondary | ICD-10-CM | POA: Diagnosis not present

## 2021-02-25 DIAGNOSIS — A084 Viral intestinal infection, unspecified: Secondary | ICD-10-CM | POA: Diagnosis not present

## 2021-02-25 DIAGNOSIS — R112 Nausea with vomiting, unspecified: Secondary | ICD-10-CM | POA: Diagnosis not present

## 2021-02-28 DIAGNOSIS — E785 Hyperlipidemia, unspecified: Secondary | ICD-10-CM | POA: Diagnosis not present

## 2021-02-28 DIAGNOSIS — Z1331 Encounter for screening for depression: Secondary | ICD-10-CM | POA: Diagnosis not present

## 2021-02-28 DIAGNOSIS — Z0000 Encounter for general adult medical examination without abnormal findings: Secondary | ICD-10-CM | POA: Diagnosis not present

## 2021-02-28 DIAGNOSIS — Z9181 History of falling: Secondary | ICD-10-CM | POA: Diagnosis not present

## 2021-03-21 DIAGNOSIS — R972 Elevated prostate specific antigen [PSA]: Secondary | ICD-10-CM | POA: Diagnosis not present

## 2021-03-21 DIAGNOSIS — N401 Enlarged prostate with lower urinary tract symptoms: Secondary | ICD-10-CM | POA: Diagnosis not present

## 2021-03-21 DIAGNOSIS — E291 Testicular hypofunction: Secondary | ICD-10-CM | POA: Diagnosis not present

## 2021-03-21 DIAGNOSIS — R1031 Right lower quadrant pain: Secondary | ICD-10-CM | POA: Diagnosis not present

## 2021-03-21 DIAGNOSIS — R1032 Left lower quadrant pain: Secondary | ICD-10-CM | POA: Diagnosis not present

## 2021-03-29 DIAGNOSIS — Z86711 Personal history of pulmonary embolism: Secondary | ICD-10-CM | POA: Diagnosis not present

## 2021-03-29 DIAGNOSIS — Z955 Presence of coronary angioplasty implant and graft: Secondary | ICD-10-CM | POA: Diagnosis not present

## 2021-03-29 DIAGNOSIS — Z951 Presence of aortocoronary bypass graft: Secondary | ICD-10-CM | POA: Diagnosis not present

## 2021-03-29 DIAGNOSIS — I2510 Atherosclerotic heart disease of native coronary artery without angina pectoris: Secondary | ICD-10-CM | POA: Diagnosis not present

## 2021-03-29 DIAGNOSIS — Z7901 Long term (current) use of anticoagulants: Secondary | ICD-10-CM | POA: Diagnosis not present

## 2021-03-29 DIAGNOSIS — E782 Mixed hyperlipidemia: Secondary | ICD-10-CM | POA: Diagnosis not present

## 2021-04-16 DIAGNOSIS — M19011 Primary osteoarthritis, right shoulder: Secondary | ICD-10-CM | POA: Diagnosis not present

## 2021-04-17 DIAGNOSIS — J449 Chronic obstructive pulmonary disease, unspecified: Secondary | ICD-10-CM | POA: Diagnosis not present

## 2021-04-17 DIAGNOSIS — J069 Acute upper respiratory infection, unspecified: Secondary | ICD-10-CM | POA: Diagnosis not present

## 2021-04-17 DIAGNOSIS — G4733 Obstructive sleep apnea (adult) (pediatric): Secondary | ICD-10-CM | POA: Diagnosis not present

## 2021-04-17 DIAGNOSIS — K219 Gastro-esophageal reflux disease without esophagitis: Secondary | ICD-10-CM | POA: Diagnosis not present

## 2021-04-17 DIAGNOSIS — R918 Other nonspecific abnormal finding of lung field: Secondary | ICD-10-CM | POA: Diagnosis not present

## 2021-04-29 DIAGNOSIS — M1612 Unilateral primary osteoarthritis, left hip: Secondary | ICD-10-CM | POA: Diagnosis not present

## 2021-04-29 DIAGNOSIS — M25552 Pain in left hip: Secondary | ICD-10-CM | POA: Diagnosis not present

## 2021-04-29 DIAGNOSIS — Z96641 Presence of right artificial hip joint: Secondary | ICD-10-CM | POA: Diagnosis not present

## 2021-05-06 ENCOUNTER — Other Ambulatory Visit: Payer: Self-pay | Admitting: Family Medicine

## 2021-05-06 ENCOUNTER — Other Ambulatory Visit (HOSPITAL_COMMUNITY): Payer: Self-pay | Admitting: Family Medicine

## 2021-05-06 DIAGNOSIS — Z6827 Body mass index (BMI) 27.0-27.9, adult: Secondary | ICD-10-CM | POA: Diagnosis not present

## 2021-05-06 DIAGNOSIS — R972 Elevated prostate specific antigen [PSA]: Secondary | ICD-10-CM

## 2021-05-06 DIAGNOSIS — Z01818 Encounter for other preprocedural examination: Secondary | ICD-10-CM | POA: Diagnosis not present

## 2021-05-06 DIAGNOSIS — H9201 Otalgia, right ear: Secondary | ICD-10-CM | POA: Diagnosis not present

## 2021-05-13 DIAGNOSIS — J449 Chronic obstructive pulmonary disease, unspecified: Secondary | ICD-10-CM | POA: Diagnosis not present

## 2021-05-13 DIAGNOSIS — Z03818 Encounter for observation for suspected exposure to other biological agents ruled out: Secondary | ICD-10-CM | POA: Diagnosis not present

## 2021-05-13 DIAGNOSIS — G4733 Obstructive sleep apnea (adult) (pediatric): Secondary | ICD-10-CM | POA: Diagnosis not present

## 2021-05-13 DIAGNOSIS — R059 Cough, unspecified: Secondary | ICD-10-CM | POA: Diagnosis not present

## 2021-05-13 DIAGNOSIS — K219 Gastro-esophageal reflux disease without esophagitis: Secondary | ICD-10-CM | POA: Diagnosis not present

## 2021-05-13 DIAGNOSIS — R918 Other nonspecific abnormal finding of lung field: Secondary | ICD-10-CM | POA: Diagnosis not present

## 2021-05-14 DIAGNOSIS — R051 Acute cough: Secondary | ICD-10-CM | POA: Diagnosis not present

## 2021-05-20 ENCOUNTER — Ambulatory Visit: Payer: Self-pay | Admitting: Student

## 2021-05-27 ENCOUNTER — Ambulatory Visit: Payer: Self-pay | Admitting: Student

## 2021-05-27 NOTE — H&P (Signed)
TOTAL HIP ADMISSION H&P  Patient is admitted for left total hip arthroplasty.  Subjective:  Chief Complaint: left hip pain  HPI: Matthew Adams, 60 y.o. male, has a history of pain and functional disability in the left hip(s) due to arthritis and patient has failed non-surgical conservative treatments for greater than 12 weeks to include NSAID's and/or analgesics and activity modification.  Onset of symptoms was gradual starting 3 years ago with gradually worsening course since that time.The patient noted no past surgery on the left hip(s).  Patient currently rates pain in the left hip at 8 out of 10 with activity. Patient has worsening of pain with activity and weight bearing, pain that interfers with activities of daily living, and pain with passive range of motion. Patient has evidence of subchondral cysts, subchondral sclerosis, and joint space narrowing by imaging studies. This condition presents safety issues increasing the risk of falls.  There is no current active infection.  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   COPD (chronic obstructive pulmonary disease) ([place])    Coronary artery disease    Dyspnea    Heart disease    Coronary stents placed 2006   History of appendectomy    Hypercholesterolemia    Hypertension    Myocardial infarction [facility])    pt denies   Pneumonia 07/2020   Pulmonary embolism ([place])    Sleep apnea     Past Surgical History:  Procedure Laterality Date   APPENDECTOMY     CARDIAC CATHETERIZATION  2019   1 stent   CORONARY ARTERY BYPASS GRAFT   2014   TOTAL HIP ARTHROPLASTY Right 09/19/2020   Procedure: TOTAL HIP ARTHROPLASTY ANTERIOR APPROACH;  Surgeon: Rod Can, MD;  Location: [facility] ORS;  Service: Orthopedics;  Laterality: Right;   TRANSPHENOIDAL PITUITARY RESECTION  2003   recurrance treated with gamma knife    Current Outpatient Medications  Medication Sig Dispense Refill Last Dose   albuterol (PROVENTIL) (2.5 MG/3ML) 0.083% nebulizer solution Take 2.5 mg by nebulization every 6 (six) hours as needed for wheezing or shortness of breath.      albuterol (VENTOLIN HFA) 108 (90 Base) MCG/ACT inhaler Inhale 1-2 puffs into the lungs every 6 (six) hours as needed for wheezing or shortness of breath.      cetirizine (ZYRTEC) 10 MG tablet Take 10 mg by mouth daily.      cyclobenzaprine (FLEXERIL) 5 MG tablet Take 5 mg by mouth daily.      docusate sodium (COLACE) 100 MG capsule Take 1 capsule (100 mg total) by mouth 2 (two) times daily. 120 capsule 0    Fluticasone-Salmeterol (ADVAIR) 500-50 MCG/DOSE AEPB Inhale 1 puff into the lungs 2 (two) times daily.      gabapentin (NEURONTIN) 300 MG capsule Take 300 mg by mouth at bedtime.      hydrocortisone (CORTEF) 10 MG tablet Take 10 mg by mouth at bedtime.      levothyroxine (SYNTHROID) 75 MCG tablet Take 75 mcg by mouth daily before breakfast.  metoprolol tartrate (LOPRESSOR) 25 MG tablet Take 12.5-25 mg by mouth See admin instructions. Take 1 tablet (25 mg) by mouth in the morning & take 0.5 tablet (12.5 mg) by mouth at night.      montelukast (SINGULAIR) 10 MG tablet Take 10 mg by mouth daily.      NEOMYCIN-POLYMYXIN-HYDROCORTISONE (CORTISPORIN) 1 % SOLN OTIC solution Place 3 drops into both ears daily as needed.      nitroGLYCERIN (NITROSTAT) 0.4 MG SL tablet Place 0.4 mg under the tongue every 5 (five) minutes x 3 doses as needed for chest pain.      ondansetron (ZOFRAN) 4 MG tablet TAKE 1 TABLET BY MOUTH EVERY 6 HOURS AS NEEDED FOR NAUSEA 20 tablet 0    pantoprazole (PROTONIX) 40  MG tablet Take 40 mg by mouth at bedtime.      ranolazine (RANEXA) 500 MG 12 hr tablet Take 500 mg by mouth daily.      senna (SENOKOT) 8.6 MG TABS tablet Take 2 tablets (17.2 mg total) by mouth at bedtime. 120 tablet 0    sildenafil (REVATIO) 20 MG tablet Take 20-100 mg by mouth daily as needed for erectile dysfunction.      simvastatin (ZOCOR) 40 MG tablet Take 40 mg by mouth daily.      SPIRIVA RESPIMAT 2.5 MCG/ACT AERS Inhale 1 puff into the lungs in the morning and at bedtime.      tamsulosin (FLOMAX) 0.4 MG CAPS capsule Take 0.4 mg by mouth at bedtime.       Testosterone 20.25 MG/ACT (1.62%) GEL Apply 3 Pump topically at bedtime. AFTER SHOWERING      XARELTO 15 MG TABS tablet Take 15 mg by mouth daily after supper.      No current facility-administered medications for this visit.   No Known Allergies  Social History   Tobacco Use   Smoking status: Former    Packs/day: 2.00    Years: 25.00    Pack years: 50.00    Types: Cigarettes    Quit date: 11/10/2011    Years since quitting: 9.5   Smokeless tobacco: Never  Substance Use Topics   Alcohol use: No    Family History  Problem Relation Age of Onset   Heart disease Father      Review of Systems  Musculoskeletal:  Positive for arthralgias.  All other systems reviewed and are negative.  Objective:  Physical Exam Constitutional:      Appearance: He is normal weight.  HENT:     Head: Normocephalic.  Eyes:     Pupils: Pupils are equal, round, and reactive to light.  Cardiovascular:     Rate and Rhythm: Normal rate.     Pulses: Normal pulses.  Pulmonary:     Effort: Pulmonary effort is normal.  Abdominal:     Palpations: Abdomen is soft.     Tenderness: There is no abdominal tenderness.  Genitourinary:    Comments: Deferred Musculoskeletal:     Cervical back: Normal range of motion and neck supple.     Comments: Pain in left hip with terminal flexion and rotation  Skin:    General: Skin is warm and dry.   Neurological:     Mental Status: He is alert and oriented to person, place, and time.  Psychiatric:        Behavior: Behavior normal.    Vital signs in last 24 hours: @VSRANGES @  Labs:   Estimated body mass index is 28.26 kg/m as calculated from the  following:   Height as of 09/19/20: 5\' 9"  (1.753 m).   Weight as of 09/19/20: 86.8 kg.   Imaging Review Plain radiographs demonstrate severe degenerative joint disease of the left hip(s). The bone quality appears to be adequate for age and reported activity level.      Assessment/Plan:  End stage arthritis, left hip(s)  The patient history, physical examination, clinical judgement of the provider and imaging studies are consistent with end stage degenerative joint disease of the left hip(s) and total hip arthroplasty is deemed medically necessary. The treatment options including medical management, injection therapy, arthroscopy and arthroplasty were discussed at length. The risks and benefits of total hip arthroplasty were presented and reviewed. The risks due to aseptic loosening, infection, stiffness, dislocation/subluxation,  thromboembolic complications and other imponderables were discussed.  The patient acknowledged the explanation, agreed to proceed with the plan and consent was signed. Patient is being admitted for inpatient treatment for surgery, pain control, PT, OT, prophylactic antibiotics, VTE prophylaxis, progressive ambulation and ADL's and discharge planning.The patient is planning to be discharged  after overnight observation

## 2021-05-29 NOTE — Patient Instructions (Addendum)
DUE TO COVID-19 ONLY ONE VISITOR IS ALLOWED TO COME WITH YOU AND STAY IN THE WAITING ROOM ONLY DURING PRE OP AND PROCEDURE.   **NO VISITORS ARE ALLOWED IN THE SHORT STAY AREA OR RECOVERY ROOM!!**  IF YOU WILL BE ADMITTED INTO THE HOSPITAL YOU ARE ALLOWED ONLY TWO SUPPORT PEOPLE DURING VISITATION HOURS ONLY (10AM -8PM)   The support person(s) may change daily. The support person(s) must pass our screening, gel in and out, and wear a mask at all times, including in the patient's room. Patients must also wear a mask when staff or their support person are in the room.  No visitors under the age of 58. Any visitor under the age of 86 must be accompanied by an adult.    COVID SWAB TESTING MUST BE COMPLETED ON:  06/10/21 **MUST PRESENT COMPLETED FORM AT TESTING SITE**    [facility] [facility] (backside of the building) No appointment needed. Open 8am-3pm You are not required to quarantine, however you are required to wear a well-fitted mask when you are out and around people not in your household.  Hand Hygiene often Do NOT share personal items Notify your provider if you are in close contact with someone who has COVID or you develop fever 100.4 or greater, new onset of sneezing, cough, sore throat, shortness of breath or body aches.       Your procedure is scheduled on: 06/12/21   Report to [facility] Main  Entrance   Report to Short Stay at 5:15 AM   [facility])   Call this number if you have problems the morning of surgery [phone]   Do not eat food :After Midnight.   May have liquids until  4:30 AM  day of surgery  CLEAR LIQUID DIET  Foods Allowed                                                                     Foods Excluded  Water, Black Coffee and tea, regular and decaf               liquids that you cannot  Plain Jell-O in any flavor  (No red)                                    see through such as: Fruit ices (not with fruit pulp)                                             milk, soups, orange juice              Iced Popsicles (No red)                                               All solid food  Apple juices Sports drinks like Gatorade (No red) Lightly seasoned clear broth or consume(fat free) Sugar, honey syrup      The day of surgery:  Drink ONE (1) Pre-Surgery Clear G2 by 4:30 am the morning of surgery. Drink in one sitting. Do not sip.  This drink was given to you during your hospital  pre-op appointment visit. Nothing else to drink after completing the  Pre-Surgery G2.          If you have questions, please contact your surgeon's office.     Oral Hygiene is also important to reduce your risk of infection.                                    Remember - BRUSH YOUR TEETH THE MORNING OF SURGERY WITH YOUR REGULAR TOOTHPASTE   Do NOT smoke after Midnight   Take these medicines the morning of surgery with A SIP OF WATER: Inhalers, Hydrocortisone, Levothyroxine, Singulair, Simvastatin.                               You may not have any metal on your body including hair pins, jewelry, and body piercing             Do not wear lotions, powders, cologne, or deodorant              Men may shave face and neck.   Do not bring valuables to the hospital. [place].   Bring small overnight bag day of surgery.   Special Instructions: Bring a copy of your healthcare power of attorney and living will documents         the day of surgery if you haven't scanned them in before.   Please read over the following fact sheets you were given: IF YOU HAVE QUESTIONS ABOUT YOUR PRE OP INSTRUCTIONS PLEASE CALL [phone]   [facility] - Preparing for Surgery Before surgery, you can play an important role.  Because skin is not sterile, your skin needs to be as free of germs as possible.  You can reduce the number of germs on your skin by washing with CHG  (chlorahexidine gluconate) soap before surgery.  CHG is an antiseptic cleaner which kills germs and bonds with the skin to continue killing germs even after washing. Please DO NOT use if you have an allergy to CHG or antibacterial soaps.  If your skin becomes reddened/irritated stop using the CHG and inform your nurse when you arrive at Short Stay. Do not shave (including legs and underarms) for at least 48 hours prior to the first CHG shower.  You may shave your face/neck.  Please follow these instructions carefully:  1.  Shower with CHG Soap the night before surgery and the  morning of surgery.  2.  If you choose to wash your hair, wash your hair first as usual with your normal  shampoo.  3.  After you shampoo, rinse your hair and body thoroughly to remove the shampoo.                             4.  Use CHG as you would any other liquid soap.  You can apply chg  directly to the skin and wash.  Gently with a scrungie or clean washcloth.  5.  Apply the CHG Soap to your body ONLY FROM THE NECK DOWN.   Do   not use on face/ open                           Wound or open sores. Avoid contact with eyes, ears mouth and   genitals (private parts).                       Wash face,  Genitals (private parts) with your normal soap.             6.  Wash thoroughly, paying special attention to the area where your    surgery  will be performed.  7.  Thoroughly rinse your body with warm water from the neck down.  8.  DO NOT shower/wash with your normal soap after using and rinsing off the CHG Soap.                9.  Pat yourself dry with a clean towel.            10.  Wear clean pajamas.            11.  Place clean sheets on your bed the night of your first shower and do not  sleep with pets. Day of Surgery : Do not apply any lotions/deodorants the morning of surgery.  Please wear clean clothes to the hospital/surgery center.  FAILURE TO FOLLOW THESE INSTRUCTIONS MAY RESULT IN THE CANCELLATION OF YOUR  SURGERY  PATIENT SIGNATURE_________________________________  NURSE SIGNATURE__________________________________  ________________________________________________________________________   [name]  An incentive spirometer is a tool that can help keep your lungs clear and active. This tool measures how well you are filling your lungs with each breath. Taking long deep breaths may help reverse or decrease the chance of developing breathing (pulmonary) problems (especially infection) following: A long period of time when you are unable to move or be active. BEFORE THE PROCEDURE  If the spirometer includes an indicator to show your best effort, your nurse or respiratory therapist will set it to a desired goal. If possible, sit up straight or lean slightly forward. Try not to slouch. Hold the incentive spirometer in an upright position. INSTRUCTIONS FOR USE  Sit on the edge of your bed if possible, or sit up as far as you can in bed or on a chair. Hold the incentive spirometer in an upright position. Breathe out normally. Place the mouthpiece in your mouth and seal your lips tightly around it. Breathe in slowly and as deeply as possible, raising the piston or the ball toward the top of the column. Hold your breath for 3-5 seconds or for as long as possible. Allow the piston or ball to fall to the bottom of the column. Remove the mouthpiece from your mouth and breathe out normally. Rest for a few seconds and repeat Steps 1 through 7 at least 10 times every 1-2 hours when you are awake. Take your time and take a few normal breaths between deep breaths. The spirometer may include an indicator to show your best effort. Use the indicator as a goal to work toward during each repetition. After each set of 10 deep breaths, practice coughing to be sure your lungs are clear. If you have an incision (the cut made at the time of surgery),  support your incision when coughing by placing a pillow or  rolled up towels firmly against it. Once you are able to get out of bed, walk around indoors and cough well. You may stop using the incentive spirometer when instructed by your caregiver.  RISKS AND COMPLICATIONS Take your time so you do not get dizzy or light-headed. If you are in pain, you may need to take or ask for pain medication before doing incentive spirometry. It is harder to take a deep breath if you are having pain. AFTER USE Rest and breathe slowly and easily. It can be helpful to keep track of a log of your progress. Your caregiver can provide you with a simple table to help with this. If you are using the spirometer at home, follow these instructions: [place] IF:  You are having difficultly using the spirometer. You have trouble using the spirometer as often as instructed. Your pain medication is not giving enough relief while using the spirometer. You develop fever of 100.5 F (38.1 C) or higher. SEEK IMMEDIATE MEDICAL CARE IF:  You cough up bloody sputum that had not been present before. You develop fever of 102 F (38.9 C) or greater. You develop worsening pain at or near the incision site. MAKE SURE YOU:  Understand these instructions. Will watch your condition. Will get help right away if you are not doing well or get worse. Document Released: 02/09/2007 Document Revised: 12/22/2011 Document Reviewed: 04/12/2007 Exit[employer] Patient Information 2014 Exit[employer], [place].   ________________________________________________________________________  WHAT IS A BLOOD TRANSFUSION? Blood Transfusion Information  A transfusion is the replacement of blood or some of its parts. Blood is made up of multiple cells which provide different functions. Red blood cells carry oxygen and are used for blood loss replacement. White blood cells fight against infection. Platelets control bleeding. Plasma helps clot blood. Other blood products are available for specialized needs, such  as hemophilia or other clotting disorders. BEFORE THE TRANSFUSION  Who gives blood for transfusions?  Healthy volunteers who are fully evaluated to make sure their blood is safe. This is blood bank blood. Transfusion therapy is the safest it has ever been in the practice of medicine. Before blood is taken from a donor, a complete history is taken to make sure that person has no history of diseases nor engages in risky social behavior (examples are intravenous drug use or sexual activity with multiple partners). The donor's travel history is screened to minimize risk of transmitting infections, such as malaria. The donated blood is tested for signs of infectious diseases, such as HIV and hepatitis. The blood is then tested to be sure it is compatible with you in order to minimize the chance of a transfusion reaction. If you or a relative donates blood, this is often done in anticipation of surgery and is not appropriate for emergency situations. It takes many days to process the donated blood. RISKS AND COMPLICATIONS Although transfusion therapy is very safe and saves many lives, the main dangers of transfusion include:  Getting an infectious disease. Developing a transfusion reaction. This is an allergic reaction to something in the blood you were given. Every precaution is taken to prevent this. The decision to have a blood transfusion has been considered carefully by your caregiver before blood is given. Blood is not given unless the benefits outweigh the risks. AFTER THE TRANSFUSION Right after receiving a blood transfusion, you will usually feel much better and more energetic. This is especially true if your red  blood cells have gotten low (anemic). The transfusion raises the level of the red blood cells which carry oxygen, and this usually causes an energy increase. The nurse administering the transfusion will monitor you carefully for complications. HOME CARE INSTRUCTIONS  No special instructions  are needed after a transfusion. You may find your energy is better. Speak with your caregiver about any limitations on activity for underlying diseases you may have. SEEK MEDICAL CARE IF:  Your condition is not improving after your transfusion. You develop redness or irritation at the intravenous (IV) site. SEEK IMMEDIATE MEDICAL CARE IF:  Any of the following symptoms occur over the next 12 hours: Shaking chills. You have a temperature by mouth above 102 F (38.9 C), not controlled by medicine. Chest, back, or muscle pain. People around you feel you are not acting correctly or are confused. Shortness of breath or difficulty breathing. Dizziness and fainting. You get a rash or develop hives. You have a decrease in urine output. Your urine turns a dark color or changes to pink, red, or brown. Any of the following symptoms occur over the next 10 days: You have a temperature by mouth above 102 F (38.9 C), not controlled by medicine. Shortness of breath. Weakness after normal activity. The white part of the eye turns yellow (jaundice). You have a decrease in the amount of urine or are urinating less often. Your urine turns a dark color or changes to pink, red, or brown. Document Released: 09/26/2000 Document Revised: 12/22/2011 Document Reviewed: 05/15/2008 [facility] Patient Information 2014 [place], [place].  _______________________________________________________________________

## 2021-05-29 NOTE — Progress Notes (Addendum)
COVID swab appointment: 06/10/21  COVID Vaccine Completed: yes x3 Date COVID Vaccine completed: 01/16/20, 02/08/20 Has received booster: 09/14/20 COVID vaccine manufacturer: Pfizer      Date of COVID positive in last 90 days: No  PCP - Nelda Bucks, MD Cardiologist - Abran Richard  Chest x-ray - N/a chest CT next tuesday EKG - 05/30/21 Epic Stress Test - 2019 [facility] requested ECHO - 06/19/16 on chart Cardiac Cath - 12/29/17 Care Everywhere Pacemaker/ICD device last checked: N/a Spinal Cord Stimulator: N/a  Sleep Study - yes positive sleep apnea CPAP -  yes every night  Fasting Blood Sugar -  Checks Blood Sugar ___ times a day Patient used to have DM but 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 Once tumor was removed DM resolved. Checked blood sugar at PAT, result was 104.  Blood Thinner Instructions: Xarelto, stop 5 days before surgery Aspirin Instructions: Last Dose:06/07/21  Activity level: Can go up a flight of stairs and perform activities of daily living without stopping and without symptoms of chest pain. Endorses SOB from time to time. He has COPD.     Anesthesia review: HTN, OSA, COPD  Patient denies shortness of breath, fever, cough and chest pain at PAT appointment   Patient verbalized understanding of instructions that were given to them at the PAT appointment. Patient was also instructed that they will need to review over the PAT instructions again at home before surgery.

## 2021-05-30 ENCOUNTER — Encounter (HOSPITAL_COMMUNITY): Payer: Self-pay

## 2021-05-30 ENCOUNTER — Other Ambulatory Visit: Payer: Self-pay

## 2021-05-30 ENCOUNTER — Encounter (HOSPITAL_COMMUNITY)
Admission: RE | Admit: 2021-05-30 | Discharge: 2021-05-30 | Disposition: A | Discharge status: Home / Self Care | Payer: PPO | Source: Ambulatory Visit | Attending: Orthopedic Surgery | Admitting: Orthopedic Surgery

## 2021-05-30 DIAGNOSIS — I440 Atrioventricular block, first degree: Secondary | ICD-10-CM | POA: Diagnosis not present

## 2021-05-30 DIAGNOSIS — Z01818 Encounter for other preprocedural examination: Secondary | ICD-10-CM | POA: Diagnosis not present

## 2021-05-30 DIAGNOSIS — Z7901 Long term (current) use of anticoagulants: Secondary | ICD-10-CM | POA: Diagnosis not present

## 2021-05-30 HISTORY — DX: Gastro-esophageal reflux disease without esophagitis: K21.9

## 2021-05-30 HISTORY — DX: Hypothyroidism, unspecified: E03.9

## 2021-05-30 LAB — CBC
HCT: 47.5 % (ref 39.0–52.0)
Hemoglobin: 15 g/dL (ref 13.0–17.0)
MCH: 27.6 pg (ref 26.0–34.0)
MCHC: 31.6 g/dL (ref 30.0–36.0)
MCV: 87.3 fL (ref 80.0–100.0)
Platelets: 268 10*3/uL (ref 150–400)
RBC: 5.44 MIL/uL (ref 4.22–5.81)
RDW: 14.6 % (ref 11.5–15.5)
WBC: 11.8 10*3/uL — ABNORMAL HIGH (ref 4.0–10.5)
nRBC: 0 % (ref 0.0–0.2)

## 2021-05-30 LAB — GLUCOSE, CAPILLARY: Glucose-Capillary: 104 mg/dL — ABNORMAL HIGH (ref 70–99)

## 2021-05-30 LAB — COMPREHENSIVE METABOLIC PANEL
ALT: 10 U/L (ref 0–44)
AST: 14 U/L — ABNORMAL LOW (ref 15–41)
Albumin: 3.8 g/dL (ref 3.5–5.0)
Alkaline Phosphatase: 41 U/L (ref 38–126)
Anion gap: 7 (ref 5–15)
BUN: 15 mg/dL (ref 8–23)
CO2: 27 mmol/L (ref 22–32)
Calcium: 9.1 mg/dL (ref 8.9–10.3)
Chloride: 103 mmol/L (ref 98–111)
Creatinine, Ser: 1.09 mg/dL (ref 0.61–1.24)
GFR, Estimated: >60 mL/min (ref >60)
Glucose, Bld: 78 mg/dL (ref 70–99)
Potassium: 4.1 mmol/L (ref 3.5–5.1)
Sodium: 137 mmol/L (ref 135–145)
Total Bilirubin: 0.7 mg/dL (ref 0.3–1.2)
Total Protein: 7.4 g/dL (ref 6.5–8.1)

## 2021-05-30 LAB — TYPE AND SCREEN
ABO/RH(D): O POS
Antibody Screen: NEGATIVE

## 2021-05-30 LAB — SURGICAL PCR SCREEN
MRSA, PCR: NEGATIVE
Staphylococcus aureus: NEGATIVE

## 2021-05-30 LAB — HEMOGLOBIN A1C
Hgb A1c MFr Bld: 6 % — ABNORMAL HIGH (ref 4.8–5.6)
Mean Plasma Glucose: 125.5 mg/dL

## 2021-05-30 LAB — PROTIME-INR
INR: 1.1 (ref 0.8–1.2)
Prothrombin Time: 14.4 seconds (ref 11.4–15.2)

## 2021-06-04 DIAGNOSIS — R918 Other nonspecific abnormal finding of lung field: Secondary | ICD-10-CM | POA: Diagnosis not present

## 2021-06-04 DIAGNOSIS — R911 Solitary pulmonary nodule: Secondary | ICD-10-CM | POA: Diagnosis not present

## 2021-06-04 DIAGNOSIS — J439 Emphysema, unspecified: Secondary | ICD-10-CM | POA: Diagnosis not present

## 2021-06-04 DIAGNOSIS — I700 Atherosclerosis of aorta: Secondary | ICD-10-CM | POA: Diagnosis not present

## 2021-06-05 ENCOUNTER — Encounter (HOSPITAL_COMMUNITY): Payer: Self-pay | Admitting: Physician Assistant

## 2021-06-05 NOTE — Anesthesia Preprocedure Evaluation (Deleted)
Anesthesia Evaluation    Airway        Dental   Pulmonary Current Smoker,           Cardiovascular hypertension,      Neuro/Psych    GI/Hepatic   Endo/Other    Renal/GU      Musculoskeletal   Abdominal   Peds  Hematology   Anesthesia Other Findings   Reproductive/Obstetrics                             Anesthesia Physical Anesthesia Plan  ASA:   Anesthesia Plan:    Post-op Pain Management:    Induction:   PONV Risk Score and Plan:   Airway Management Planned:   Additional Equipment:   Intra-op Plan:   Post-operative Plan:   Informed Consent:   Plan Discussed with:   Anesthesia Plan Comments: (See PAT note 05/30/2021, Konrad Felix Ward, PA-C)        Anesthesia Quick Evaluation

## 2021-06-05 NOTE — Progress Notes (Signed)
Anesthesia Chart Review   Case: [id number] Date/Time: 06/12/21 0715   Procedure: TOTAL HIP ARTHROPLASTY ANTERIOR APPROACH (Left: Hip) - 140min   Anesthesia type: Spinal   Pre-op diagnosis: Left hip osteoarthritis   Location: [facility] ROOM 07 / [facility]   Surgeons: Rod Can, MD       DISCUSSION:60 y.o. every day smoker with h/o GERD, HTN, sleep apnea, COPD, PE, CAD (CABG x 4, stent), scheduled for above procedure 06/12/2021 with Dr. Rod Can.   Pt last seen by cardiology 03/29/2021. Per OV note pt doing well, CAD stable, asymptomatic, BP well controlled. Clearance received from cardiology which states he is low risk.   Xarelto managed by PCP, per PCP pt can hold 5 days prior to procedure.   Pulmonology clearance received which states pt is moderate risk due to significant COPD with cough, at baseline at PAT visit.  Evaluate DOS.  VS: BP (!) 154/85   Pulse 75   Temp 36.9 C (Oral)   Resp 16   Ht 5\' 10"  (1.778 m)   Wt 81.7 kg   SpO2 97%   BMI 25.86 kg/m   PROVIDERS: Nicoletta Dress, MD is PCP   Abran Richard, MD is Cardiologist  LABS: Labs reviewed: Acceptable for surgery. (all labs ordered are listed, but only abnormal results are displayed)  Labs Reviewed  HEMOGLOBIN A1C - Abnormal; Notable for the following components:      Result Value   Hgb A1c MFr Bld 6.0 (*)    All other components within normal limits  CBC - Abnormal; Notable for the following components:   WBC 11.8 (*)    All other components within normal limits  COMPREHENSIVE METABOLIC PANEL - Abnormal; Notable for the following components:   AST 14 (*)    All other components within normal limits  GLUCOSE, CAPILLARY - Abnormal; Notable for the following components:   Glucose-Capillary 104 (*)    All other components within normal limits  SURGICAL PCR SCREEN  PROTIME-INR  TYPE AND SCREEN     IMAGES:   EKG: 05/30/2021 Rate 71 bpm  Sinus rhythm with 1st degree A-V block T wave abnormality,  consider anterior ischemia Abnormal ECG  CV: Cardiac Cath 12/29/2017 Diagnostic Procedure Summary  1. Severe native CAD  2. All grafts are patent  3. Normal LV systolic function  Past Medical History:  Diagnosis Date   Arthritis    hips   Brain tumor ([place])    2 Brain tumors. Pituitary gland S/P Surgery and Radiation   COPD (chronic obstructive pulmonary disease) (HCC)    Coronary artery disease    Dyspnea    GERD (gastroesophageal reflux disease)    Heart disease    Coronary stents placed 2006   History of appendectomy    Hypercholesterolemia    Hypertension    Hypothyroidism    Myocardial infarction [facility])    pt denies   Pneumonia 07/2020   Pulmonary embolism ([place])    Sleep apnea     Past Surgical History:  Procedure Laterality Date   APPENDECTOMY     CARDIAC CATHETERIZATION  2019   1 stent   CORONARY ARTERY BYPASS GRAFT  2014   TOTAL HIP ARTHROPLASTY Right 09/19/2020   Procedure: TOTAL HIP ARTHROPLASTY ANTERIOR APPROACH;  Surgeon: Rod Can, MD;  Location: [facility];  Service: Orthopedics;  Laterality: Right;   TRANSPHENOIDAL PITUITARY RESECTION  2003   recurrance treated with gamma knife    MEDICATIONS:  albuterol (PROVENTIL) (2.5 MG/3ML) 0.083% nebulizer solution  albuterol (VENTOLIN HFA) 108 (90 Base) MCG/ACT inhaler   docusate sodium (COLACE) 100 MG capsule   Fluticasone-Salmeterol (ADVAIR) 500-50 MCG/DOSE AEPB   gabapentin (NEURONTIN) 300 MG capsule   hydrocortisone (CORTEF) 10 MG tablet   levothyroxine (SYNTHROID) 100 MCG tablet   metoprolol tartrate (LOPRESSOR) 25 MG tablet   montelukast (SINGULAIR) 10 MG tablet   nitroGLYCERIN (NITROSTAT) 0.4 MG SL tablet   ondansetron (ZOFRAN) 4 MG tablet   pantoprazole (PROTONIX) 40 MG tablet   senna (SENOKOT) 8.6 MG TABS tablet   sildenafil (REVATIO) 20 MG tablet   simvastatin (ZOCOR) 40 MG tablet   SPIRIVA RESPIMAT 2.5 MCG/ACT AERS   tamsulosin (FLOMAX) 0.4 MG CAPS capsule   Testosterone 20.25 MG/ACT  (1.62%) GEL   XARELTO 15 MG TABS tablet   No current facility-administered medications for this encounter.     Konrad Felix Ward, PA-C [facility]-Surgical Testing [phone]

## 2021-06-06 DIAGNOSIS — J209 Acute bronchitis, unspecified: Secondary | ICD-10-CM | POA: Diagnosis not present

## 2021-06-06 DIAGNOSIS — M87059 Idiopathic aseptic necrosis of unspecified femur: Secondary | ICD-10-CM | POA: Diagnosis not present

## 2021-06-06 DIAGNOSIS — J441 Chronic obstructive pulmonary disease with (acute) exacerbation: Secondary | ICD-10-CM | POA: Diagnosis not present

## 2021-06-10 DIAGNOSIS — R0602 Shortness of breath: Secondary | ICD-10-CM | POA: Diagnosis not present

## 2021-06-10 DIAGNOSIS — J441 Chronic obstructive pulmonary disease with (acute) exacerbation: Secondary | ICD-10-CM | POA: Diagnosis not present

## 2021-06-10 DIAGNOSIS — R059 Cough, unspecified: Secondary | ICD-10-CM | POA: Diagnosis not present

## 2021-06-12 ENCOUNTER — Ambulatory Visit (HOSPITAL_COMMUNITY): Admission: RE | Admit: 2021-06-12 | Payer: PPO | Source: Home / Self Care | Admitting: Orthopedic Surgery

## 2021-06-12 ENCOUNTER — Encounter (HOSPITAL_COMMUNITY): Admission: RE | Payer: Self-pay | Source: Home / Self Care

## 2021-06-12 SURGERY — ARTHROPLASTY, HIP, TOTAL, ANTERIOR APPROACH
Anesthesia: Spinal | Site: Hip | Laterality: Left

## 2021-06-13 DIAGNOSIS — J209 Acute bronchitis, unspecified: Secondary | ICD-10-CM | POA: Diagnosis not present

## 2021-06-13 DIAGNOSIS — Z6826 Body mass index (BMI) 26.0-26.9, adult: Secondary | ICD-10-CM | POA: Diagnosis not present

## 2021-06-13 DIAGNOSIS — J441 Chronic obstructive pulmonary disease with (acute) exacerbation: Secondary | ICD-10-CM | POA: Diagnosis not present

## 2021-06-13 DIAGNOSIS — M87059 Idiopathic aseptic necrosis of unspecified femur: Secondary | ICD-10-CM | POA: Diagnosis not present

## 2021-06-20 DIAGNOSIS — J441 Chronic obstructive pulmonary disease with (acute) exacerbation: Secondary | ICD-10-CM | POA: Diagnosis not present

## 2021-07-04 DIAGNOSIS — J181 Lobar pneumonia, unspecified organism: Secondary | ICD-10-CM | POA: Diagnosis not present

## 2021-07-05 DIAGNOSIS — J439 Emphysema, unspecified: Secondary | ICD-10-CM | POA: Diagnosis not present

## 2021-07-05 DIAGNOSIS — Z7951 Long term (current) use of inhaled steroids: Secondary | ICD-10-CM | POA: Diagnosis not present

## 2021-07-05 DIAGNOSIS — Z8701 Personal history of pneumonia (recurrent): Secondary | ICD-10-CM | POA: Diagnosis not present

## 2021-07-05 DIAGNOSIS — I25810 Atherosclerosis of coronary artery bypass graft(s) without angina pectoris: Secondary | ICD-10-CM | POA: Diagnosis not present

## 2021-07-05 DIAGNOSIS — E785 Hyperlipidemia, unspecified: Secondary | ICD-10-CM | POA: Diagnosis not present

## 2021-07-05 DIAGNOSIS — I214 Non-ST elevation (NSTEMI) myocardial infarction: Secondary | ICD-10-CM | POA: Diagnosis not present

## 2021-07-05 DIAGNOSIS — E782 Mixed hyperlipidemia: Secondary | ICD-10-CM | POA: Diagnosis not present

## 2021-07-05 DIAGNOSIS — I252 Old myocardial infarction: Secondary | ICD-10-CM

## 2021-07-05 DIAGNOSIS — Z955 Presence of coronary angioplasty implant and graft: Secondary | ICD-10-CM | POA: Diagnosis not present

## 2021-07-05 DIAGNOSIS — E2749 Other adrenocortical insufficiency: Secondary | ICD-10-CM | POA: Diagnosis not present

## 2021-07-05 DIAGNOSIS — Z86711 Personal history of pulmonary embolism: Secondary | ICD-10-CM | POA: Diagnosis not present

## 2021-07-05 DIAGNOSIS — I2582 Chronic total occlusion of coronary artery: Secondary | ICD-10-CM | POA: Diagnosis not present

## 2021-07-05 DIAGNOSIS — R079 Chest pain, unspecified: Secondary | ICD-10-CM | POA: Diagnosis not present

## 2021-07-05 DIAGNOSIS — Z9989 Dependence on other enabling machines and devices: Secondary | ICD-10-CM | POA: Diagnosis not present

## 2021-07-05 DIAGNOSIS — F17200 Nicotine dependence, unspecified, uncomplicated: Secondary | ICD-10-CM | POA: Diagnosis not present

## 2021-07-05 DIAGNOSIS — I440 Atrioventricular block, first degree: Secondary | ICD-10-CM | POA: Diagnosis not present

## 2021-07-05 DIAGNOSIS — T82868A Thrombosis of vascular prosthetic devices, implants and grafts, initial encounter: Secondary | ICD-10-CM | POA: Diagnosis not present

## 2021-07-05 DIAGNOSIS — Z049 Encounter for examination and observation for unspecified reason: Secondary | ICD-10-CM | POA: Diagnosis not present

## 2021-07-05 DIAGNOSIS — Z7982 Long term (current) use of aspirin: Secondary | ICD-10-CM | POA: Diagnosis not present

## 2021-07-05 DIAGNOSIS — Z7901 Long term (current) use of anticoagulants: Secondary | ICD-10-CM | POA: Diagnosis not present

## 2021-07-05 DIAGNOSIS — Z8249 Family history of ischemic heart disease and other diseases of the circulatory system: Secondary | ICD-10-CM | POA: Diagnosis not present

## 2021-07-05 DIAGNOSIS — R197 Diarrhea, unspecified: Secondary | ICD-10-CM | POA: Diagnosis not present

## 2021-07-05 DIAGNOSIS — E119 Type 2 diabetes mellitus without complications: Secondary | ICD-10-CM | POA: Diagnosis not present

## 2021-07-05 DIAGNOSIS — J449 Chronic obstructive pulmonary disease, unspecified: Secondary | ICD-10-CM | POA: Diagnosis not present

## 2021-07-05 DIAGNOSIS — Z801 Family history of malignant neoplasm of trachea, bronchus and lung: Secondary | ICD-10-CM | POA: Diagnosis not present

## 2021-07-05 DIAGNOSIS — E039 Hypothyroidism, unspecified: Secondary | ICD-10-CM | POA: Diagnosis not present

## 2021-07-05 DIAGNOSIS — Z79899 Other long term (current) drug therapy: Secondary | ICD-10-CM | POA: Diagnosis not present

## 2021-07-05 DIAGNOSIS — Z951 Presence of aortocoronary bypass graft: Secondary | ICD-10-CM | POA: Diagnosis not present

## 2021-07-05 DIAGNOSIS — I10 Essential (primary) hypertension: Secondary | ICD-10-CM | POA: Diagnosis not present

## 2021-07-05 DIAGNOSIS — I2510 Atherosclerotic heart disease of native coronary artery without angina pectoris: Secondary | ICD-10-CM | POA: Diagnosis not present

## 2021-07-05 DIAGNOSIS — E038 Other specified hypothyroidism: Secondary | ICD-10-CM | POA: Diagnosis not present

## 2021-07-05 DIAGNOSIS — F17210 Nicotine dependence, cigarettes, uncomplicated: Secondary | ICD-10-CM | POA: Diagnosis not present

## 2021-07-05 DIAGNOSIS — G4733 Obstructive sleep apnea (adult) (pediatric): Secondary | ICD-10-CM | POA: Diagnosis not present

## 2021-07-05 DIAGNOSIS — E230 Hypopituitarism: Secondary | ICD-10-CM | POA: Diagnosis not present

## 2021-07-05 DIAGNOSIS — Z85858 Personal history of malignant neoplasm of other endocrine glands: Secondary | ICD-10-CM | POA: Diagnosis not present

## 2021-07-05 HISTORY — DX: Old myocardial infarction: I25.2

## 2021-07-06 DIAGNOSIS — I214 Non-ST elevation (NSTEMI) myocardial infarction: Secondary | ICD-10-CM | POA: Diagnosis not present

## 2021-07-06 DIAGNOSIS — E038 Other specified hypothyroidism: Secondary | ICD-10-CM | POA: Diagnosis not present

## 2021-07-06 DIAGNOSIS — E785 Hyperlipidemia, unspecified: Secondary | ICD-10-CM | POA: Diagnosis not present

## 2021-07-06 DIAGNOSIS — Z951 Presence of aortocoronary bypass graft: Secondary | ICD-10-CM | POA: Diagnosis not present

## 2021-07-06 DIAGNOSIS — J449 Chronic obstructive pulmonary disease, unspecified: Secondary | ICD-10-CM | POA: Diagnosis not present

## 2021-07-06 DIAGNOSIS — E119 Type 2 diabetes mellitus without complications: Secondary | ICD-10-CM | POA: Diagnosis not present

## 2021-07-06 DIAGNOSIS — Z86711 Personal history of pulmonary embolism: Secondary | ICD-10-CM | POA: Diagnosis not present

## 2021-07-06 DIAGNOSIS — I10 Essential (primary) hypertension: Secondary | ICD-10-CM | POA: Diagnosis not present

## 2021-07-06 DIAGNOSIS — E2749 Other adrenocortical insufficiency: Secondary | ICD-10-CM | POA: Diagnosis not present

## 2021-07-07 DIAGNOSIS — E119 Type 2 diabetes mellitus without complications: Secondary | ICD-10-CM | POA: Diagnosis not present

## 2021-07-07 DIAGNOSIS — R197 Diarrhea, unspecified: Secondary | ICD-10-CM | POA: Diagnosis not present

## 2021-07-07 DIAGNOSIS — E785 Hyperlipidemia, unspecified: Secondary | ICD-10-CM | POA: Diagnosis not present

## 2021-07-07 DIAGNOSIS — J449 Chronic obstructive pulmonary disease, unspecified: Secondary | ICD-10-CM | POA: Diagnosis not present

## 2021-07-07 DIAGNOSIS — Z951 Presence of aortocoronary bypass graft: Secondary | ICD-10-CM | POA: Diagnosis not present

## 2021-07-07 DIAGNOSIS — E2749 Other adrenocortical insufficiency: Secondary | ICD-10-CM | POA: Diagnosis not present

## 2021-07-07 DIAGNOSIS — E038 Other specified hypothyroidism: Secondary | ICD-10-CM | POA: Diagnosis not present

## 2021-07-07 DIAGNOSIS — I214 Non-ST elevation (NSTEMI) myocardial infarction: Secondary | ICD-10-CM | POA: Diagnosis not present

## 2021-07-07 DIAGNOSIS — I10 Essential (primary) hypertension: Secondary | ICD-10-CM | POA: Diagnosis not present

## 2021-07-07 DIAGNOSIS — Z86711 Personal history of pulmonary embolism: Secondary | ICD-10-CM | POA: Diagnosis not present

## 2021-07-08 DIAGNOSIS — Z86711 Personal history of pulmonary embolism: Secondary | ICD-10-CM | POA: Diagnosis not present

## 2021-07-08 DIAGNOSIS — R197 Diarrhea, unspecified: Secondary | ICD-10-CM | POA: Diagnosis not present

## 2021-07-08 DIAGNOSIS — E038 Other specified hypothyroidism: Secondary | ICD-10-CM | POA: Diagnosis not present

## 2021-07-08 DIAGNOSIS — E119 Type 2 diabetes mellitus without complications: Secondary | ICD-10-CM | POA: Diagnosis not present

## 2021-07-08 DIAGNOSIS — E785 Hyperlipidemia, unspecified: Secondary | ICD-10-CM | POA: Diagnosis not present

## 2021-07-08 DIAGNOSIS — J439 Emphysema, unspecified: Secondary | ICD-10-CM | POA: Diagnosis not present

## 2021-07-08 DIAGNOSIS — I214 Non-ST elevation (NSTEMI) myocardial infarction: Secondary | ICD-10-CM | POA: Diagnosis not present

## 2021-07-08 DIAGNOSIS — J449 Chronic obstructive pulmonary disease, unspecified: Secondary | ICD-10-CM | POA: Diagnosis not present

## 2021-07-08 DIAGNOSIS — E2749 Other adrenocortical insufficiency: Secondary | ICD-10-CM | POA: Diagnosis not present

## 2021-07-08 DIAGNOSIS — I10 Essential (primary) hypertension: Secondary | ICD-10-CM | POA: Diagnosis not present

## 2021-07-08 DIAGNOSIS — Z951 Presence of aortocoronary bypass graft: Secondary | ICD-10-CM | POA: Diagnosis not present

## 2021-07-09 DIAGNOSIS — I214 Non-ST elevation (NSTEMI) myocardial infarction: Secondary | ICD-10-CM | POA: Diagnosis not present

## 2021-07-15 DIAGNOSIS — R079 Chest pain, unspecified: Secondary | ICD-10-CM | POA: Diagnosis not present

## 2021-07-17 DIAGNOSIS — J449 Chronic obstructive pulmonary disease, unspecified: Secondary | ICD-10-CM | POA: Diagnosis not present

## 2021-07-17 DIAGNOSIS — R918 Other nonspecific abnormal finding of lung field: Secondary | ICD-10-CM | POA: Diagnosis not present

## 2021-07-17 DIAGNOSIS — G4733 Obstructive sleep apnea (adult) (pediatric): Secondary | ICD-10-CM | POA: Diagnosis not present

## 2021-07-17 DIAGNOSIS — K219 Gastro-esophageal reflux disease without esophagitis: Secondary | ICD-10-CM | POA: Diagnosis not present

## 2021-07-18 DIAGNOSIS — D72829 Elevated white blood cell count, unspecified: Secondary | ICD-10-CM | POA: Diagnosis not present

## 2021-07-18 DIAGNOSIS — M87059 Idiopathic aseptic necrosis of unspecified femur: Secondary | ICD-10-CM | POA: Diagnosis not present

## 2021-07-18 DIAGNOSIS — Z79899 Other long term (current) drug therapy: Secondary | ICD-10-CM | POA: Diagnosis not present

## 2021-07-18 DIAGNOSIS — I214 Non-ST elevation (NSTEMI) myocardial infarction: Secondary | ICD-10-CM | POA: Diagnosis not present

## 2021-07-18 DIAGNOSIS — I2510 Atherosclerotic heart disease of native coronary artery without angina pectoris: Secondary | ICD-10-CM | POA: Diagnosis not present

## 2021-07-18 DIAGNOSIS — E237 Disorder of pituitary gland, unspecified: Secondary | ICD-10-CM | POA: Diagnosis not present

## 2021-07-18 DIAGNOSIS — Z23 Encounter for immunization: Secondary | ICD-10-CM | POA: Diagnosis not present

## 2021-07-18 DIAGNOSIS — Z7689 Persons encountering health services in other specified circumstances: Secondary | ICD-10-CM | POA: Diagnosis not present

## 2021-07-18 DIAGNOSIS — F430 Acute stress reaction: Secondary | ICD-10-CM | POA: Diagnosis not present

## 2021-07-26 DIAGNOSIS — R1031 Right lower quadrant pain: Secondary | ICD-10-CM | POA: Diagnosis not present

## 2021-07-26 DIAGNOSIS — N401 Enlarged prostate with lower urinary tract symptoms: Secondary | ICD-10-CM | POA: Diagnosis not present

## 2021-07-26 DIAGNOSIS — R1032 Left lower quadrant pain: Secondary | ICD-10-CM | POA: Diagnosis not present

## 2021-07-26 DIAGNOSIS — R972 Elevated prostate specific antigen [PSA]: Secondary | ICD-10-CM | POA: Diagnosis not present

## 2021-07-26 DIAGNOSIS — E291 Testicular hypofunction: Secondary | ICD-10-CM | POA: Diagnosis not present

## 2021-08-02 DIAGNOSIS — G4733 Obstructive sleep apnea (adult) (pediatric): Secondary | ICD-10-CM | POA: Diagnosis not present

## 2021-08-02 DIAGNOSIS — E785 Hyperlipidemia, unspecified: Secondary | ICD-10-CM | POA: Diagnosis not present

## 2021-08-02 DIAGNOSIS — Z7901 Long term (current) use of anticoagulants: Secondary | ICD-10-CM | POA: Diagnosis not present

## 2021-08-02 DIAGNOSIS — J439 Emphysema, unspecified: Secondary | ICD-10-CM | POA: Diagnosis not present

## 2021-08-02 DIAGNOSIS — I2510 Atherosclerotic heart disease of native coronary artery without angina pectoris: Secondary | ICD-10-CM | POA: Diagnosis not present

## 2021-08-02 DIAGNOSIS — Z86711 Personal history of pulmonary embolism: Secondary | ICD-10-CM | POA: Diagnosis not present

## 2021-08-05 DIAGNOSIS — R739 Hyperglycemia, unspecified: Secondary | ICD-10-CM | POA: Diagnosis not present

## 2021-08-05 DIAGNOSIS — J309 Allergic rhinitis, unspecified: Secondary | ICD-10-CM | POA: Diagnosis not present

## 2021-08-05 DIAGNOSIS — E559 Vitamin D deficiency, unspecified: Secondary | ICD-10-CM | POA: Diagnosis not present

## 2021-08-05 DIAGNOSIS — Z6826 Body mass index (BMI) 26.0-26.9, adult: Secondary | ICD-10-CM | POA: Diagnosis not present

## 2021-08-05 DIAGNOSIS — Z79899 Other long term (current) drug therapy: Secondary | ICD-10-CM | POA: Diagnosis not present

## 2021-08-05 DIAGNOSIS — E039 Hypothyroidism, unspecified: Secondary | ICD-10-CM | POA: Diagnosis not present

## 2021-08-05 DIAGNOSIS — I519 Heart disease, unspecified: Secondary | ICD-10-CM | POA: Diagnosis not present

## 2021-08-05 DIAGNOSIS — E785 Hyperlipidemia, unspecified: Secondary | ICD-10-CM | POA: Diagnosis not present

## 2021-08-05 DIAGNOSIS — E237 Disorder of pituitary gland, unspecified: Secondary | ICD-10-CM | POA: Diagnosis not present

## 2021-08-05 DIAGNOSIS — I119 Hypertensive heart disease without heart failure: Secondary | ICD-10-CM | POA: Diagnosis not present

## 2021-08-05 DIAGNOSIS — Z86711 Personal history of pulmonary embolism: Secondary | ICD-10-CM | POA: Diagnosis not present

## 2021-08-14 DIAGNOSIS — K2270 Barrett's esophagus without dysplasia: Secondary | ICD-10-CM | POA: Diagnosis not present

## 2021-08-14 DIAGNOSIS — K648 Other hemorrhoids: Secondary | ICD-10-CM | POA: Diagnosis not present

## 2021-08-14 DIAGNOSIS — Z7901 Long term (current) use of anticoagulants: Secondary | ICD-10-CM | POA: Diagnosis not present

## 2021-08-14 DIAGNOSIS — R1310 Dysphagia, unspecified: Secondary | ICD-10-CM | POA: Diagnosis not present

## 2021-08-14 DIAGNOSIS — K219 Gastro-esophageal reflux disease without esophagitis: Secondary | ICD-10-CM | POA: Diagnosis not present

## 2021-08-14 DIAGNOSIS — K5790 Diverticulosis of intestine, part unspecified, without perforation or abscess without bleeding: Secondary | ICD-10-CM | POA: Diagnosis not present

## 2021-08-19 DIAGNOSIS — R1310 Dysphagia, unspecified: Secondary | ICD-10-CM | POA: Diagnosis not present

## 2021-08-19 DIAGNOSIS — K222 Esophageal obstruction: Secondary | ICD-10-CM | POA: Diagnosis not present

## 2021-08-19 DIAGNOSIS — K449 Diaphragmatic hernia without obstruction or gangrene: Secondary | ICD-10-CM | POA: Diagnosis not present

## 2021-08-20 DIAGNOSIS — L820 Inflamed seborrheic keratosis: Secondary | ICD-10-CM | POA: Diagnosis not present

## 2021-08-26 DIAGNOSIS — E893 Postprocedural hypopituitarism: Secondary | ICD-10-CM | POA: Diagnosis not present

## 2021-08-26 DIAGNOSIS — E291 Testicular hypofunction: Secondary | ICD-10-CM | POA: Diagnosis not present

## 2021-08-26 DIAGNOSIS — E038 Other specified hypothyroidism: Secondary | ICD-10-CM | POA: Diagnosis not present

## 2021-08-26 DIAGNOSIS — E2749 Other adrenocortical insufficiency: Secondary | ICD-10-CM | POA: Diagnosis not present

## 2021-09-16 DIAGNOSIS — Z7901 Long term (current) use of anticoagulants: Secondary | ICD-10-CM | POA: Diagnosis not present

## 2021-09-16 DIAGNOSIS — Z951 Presence of aortocoronary bypass graft: Secondary | ICD-10-CM | POA: Diagnosis not present

## 2021-09-16 DIAGNOSIS — I2510 Atherosclerotic heart disease of native coronary artery without angina pectoris: Secondary | ICD-10-CM | POA: Diagnosis not present

## 2021-09-16 DIAGNOSIS — E782 Mixed hyperlipidemia: Secondary | ICD-10-CM | POA: Diagnosis not present

## 2021-09-16 DIAGNOSIS — I252 Old myocardial infarction: Secondary | ICD-10-CM | POA: Diagnosis not present

## 2021-09-16 DIAGNOSIS — Z955 Presence of coronary angioplasty implant and graft: Secondary | ICD-10-CM | POA: Diagnosis not present

## 2021-10-16 DIAGNOSIS — R918 Other nonspecific abnormal finding of lung field: Secondary | ICD-10-CM | POA: Diagnosis not present

## 2021-10-16 DIAGNOSIS — J449 Chronic obstructive pulmonary disease, unspecified: Secondary | ICD-10-CM | POA: Diagnosis not present

## 2021-10-16 DIAGNOSIS — G4733 Obstructive sleep apnea (adult) (pediatric): Secondary | ICD-10-CM | POA: Diagnosis not present

## 2021-10-16 DIAGNOSIS — K219 Gastro-esophageal reflux disease without esophagitis: Secondary | ICD-10-CM | POA: Diagnosis not present

## 2021-10-16 DIAGNOSIS — Z03818 Encounter for observation for suspected exposure to other biological agents ruled out: Secondary | ICD-10-CM | POA: Diagnosis not present

## 2021-10-16 DIAGNOSIS — R051 Acute cough: Secondary | ICD-10-CM | POA: Diagnosis not present

## 2021-10-24 DIAGNOSIS — G4733 Obstructive sleep apnea (adult) (pediatric): Secondary | ICD-10-CM | POA: Diagnosis not present

## 2021-10-24 DIAGNOSIS — R918 Other nonspecific abnormal finding of lung field: Secondary | ICD-10-CM | POA: Diagnosis not present

## 2021-10-24 DIAGNOSIS — F17210 Nicotine dependence, cigarettes, uncomplicated: Secondary | ICD-10-CM | POA: Diagnosis not present

## 2021-10-24 DIAGNOSIS — J449 Chronic obstructive pulmonary disease, unspecified: Secondary | ICD-10-CM | POA: Diagnosis not present

## 2021-10-24 DIAGNOSIS — K219 Gastro-esophageal reflux disease without esophagitis: Secondary | ICD-10-CM | POA: Diagnosis not present

## 2021-11-25 DIAGNOSIS — R1032 Left lower quadrant pain: Secondary | ICD-10-CM | POA: Diagnosis not present

## 2021-11-25 DIAGNOSIS — R1031 Right lower quadrant pain: Secondary | ICD-10-CM | POA: Diagnosis not present

## 2021-12-09 DIAGNOSIS — I519 Heart disease, unspecified: Secondary | ICD-10-CM | POA: Diagnosis not present

## 2021-12-09 DIAGNOSIS — E237 Disorder of pituitary gland, unspecified: Secondary | ICD-10-CM | POA: Diagnosis not present

## 2021-12-09 DIAGNOSIS — J309 Allergic rhinitis, unspecified: Secondary | ICD-10-CM | POA: Diagnosis not present

## 2021-12-09 DIAGNOSIS — E785 Hyperlipidemia, unspecified: Secondary | ICD-10-CM | POA: Diagnosis not present

## 2021-12-09 DIAGNOSIS — R739 Hyperglycemia, unspecified: Secondary | ICD-10-CM | POA: Diagnosis not present

## 2021-12-09 DIAGNOSIS — Z86711 Personal history of pulmonary embolism: Secondary | ICD-10-CM | POA: Diagnosis not present

## 2021-12-09 DIAGNOSIS — Z79899 Other long term (current) drug therapy: Secondary | ICD-10-CM | POA: Diagnosis not present

## 2021-12-09 DIAGNOSIS — I119 Hypertensive heart disease without heart failure: Secondary | ICD-10-CM | POA: Diagnosis not present

## 2021-12-09 DIAGNOSIS — E559 Vitamin D deficiency, unspecified: Secondary | ICD-10-CM | POA: Diagnosis not present

## 2021-12-09 DIAGNOSIS — E039 Hypothyroidism, unspecified: Secondary | ICD-10-CM | POA: Diagnosis not present

## 2021-12-16 DIAGNOSIS — Z86711 Personal history of pulmonary embolism: Secondary | ICD-10-CM | POA: Diagnosis not present

## 2021-12-16 DIAGNOSIS — Z951 Presence of aortocoronary bypass graft: Secondary | ICD-10-CM | POA: Diagnosis not present

## 2021-12-16 DIAGNOSIS — I2510 Atherosclerotic heart disease of native coronary artery without angina pectoris: Secondary | ICD-10-CM | POA: Diagnosis not present

## 2021-12-16 DIAGNOSIS — E893 Postprocedural hypopituitarism: Secondary | ICD-10-CM | POA: Diagnosis not present

## 2021-12-16 DIAGNOSIS — E782 Mixed hyperlipidemia: Secondary | ICD-10-CM | POA: Diagnosis not present

## 2021-12-16 DIAGNOSIS — Z955 Presence of coronary angioplasty implant and graft: Secondary | ICD-10-CM | POA: Diagnosis not present

## 2021-12-16 DIAGNOSIS — Z7901 Long term (current) use of anticoagulants: Secondary | ICD-10-CM | POA: Diagnosis not present

## 2021-12-16 DIAGNOSIS — I252 Old myocardial infarction: Secondary | ICD-10-CM | POA: Diagnosis not present

## 2022-01-17 DIAGNOSIS — M19011 Primary osteoarthritis, right shoulder: Secondary | ICD-10-CM | POA: Diagnosis not present

## 2022-01-23 DIAGNOSIS — G4733 Obstructive sleep apnea (adult) (pediatric): Secondary | ICD-10-CM | POA: Diagnosis not present

## 2022-01-23 DIAGNOSIS — J449 Chronic obstructive pulmonary disease, unspecified: Secondary | ICD-10-CM | POA: Diagnosis not present

## 2022-01-23 DIAGNOSIS — F17210 Nicotine dependence, cigarettes, uncomplicated: Secondary | ICD-10-CM | POA: Diagnosis not present

## 2022-01-23 DIAGNOSIS — R918 Other nonspecific abnormal finding of lung field: Secondary | ICD-10-CM | POA: Diagnosis not present

## 2022-01-23 DIAGNOSIS — K219 Gastro-esophageal reflux disease without esophagitis: Secondary | ICD-10-CM | POA: Diagnosis not present

## 2022-02-06 DIAGNOSIS — I700 Atherosclerosis of aorta: Secondary | ICD-10-CM | POA: Diagnosis not present

## 2022-02-06 DIAGNOSIS — E785 Hyperlipidemia, unspecified: Secondary | ICD-10-CM | POA: Diagnosis not present

## 2022-02-06 DIAGNOSIS — E038 Other specified hypothyroidism: Secondary | ICD-10-CM | POA: Diagnosis not present

## 2022-02-06 DIAGNOSIS — J441 Chronic obstructive pulmonary disease with (acute) exacerbation: Secondary | ICD-10-CM | POA: Diagnosis not present

## 2022-02-06 DIAGNOSIS — R739 Hyperglycemia, unspecified: Secondary | ICD-10-CM | POA: Diagnosis not present

## 2022-02-06 DIAGNOSIS — I119 Hypertensive heart disease without heart failure: Secondary | ICD-10-CM | POA: Diagnosis not present

## 2022-02-24 DIAGNOSIS — E893 Postprocedural hypopituitarism: Secondary | ICD-10-CM | POA: Diagnosis not present

## 2022-02-24 DIAGNOSIS — E038 Other specified hypothyroidism: Secondary | ICD-10-CM | POA: Diagnosis not present

## 2022-02-24 DIAGNOSIS — E2749 Other adrenocortical insufficiency: Secondary | ICD-10-CM | POA: Diagnosis not present

## 2022-03-28 DIAGNOSIS — R5383 Other fatigue: Secondary | ICD-10-CM | POA: Diagnosis not present

## 2022-03-28 DIAGNOSIS — D509 Iron deficiency anemia, unspecified: Secondary | ICD-10-CM | POA: Diagnosis not present

## 2022-03-28 DIAGNOSIS — R509 Fever, unspecified: Secondary | ICD-10-CM | POA: Diagnosis not present

## 2022-03-28 DIAGNOSIS — E038 Other specified hypothyroidism: Secondary | ICD-10-CM | POA: Diagnosis not present

## 2022-03-28 DIAGNOSIS — R059 Cough, unspecified: Secondary | ICD-10-CM | POA: Diagnosis not present

## 2022-03-29 DIAGNOSIS — R911 Solitary pulmonary nodule: Secondary | ICD-10-CM | POA: Diagnosis not present

## 2022-03-29 DIAGNOSIS — R531 Weakness: Secondary | ICD-10-CM | POA: Diagnosis not present

## 2022-03-29 DIAGNOSIS — Z79899 Other long term (current) drug therapy: Secondary | ICD-10-CM | POA: Diagnosis not present

## 2022-03-29 DIAGNOSIS — J439 Emphysema, unspecified: Secondary | ICD-10-CM | POA: Diagnosis not present

## 2022-03-29 DIAGNOSIS — R918 Other nonspecific abnormal finding of lung field: Secondary | ICD-10-CM | POA: Diagnosis not present

## 2022-04-03 DIAGNOSIS — R112 Nausea with vomiting, unspecified: Secondary | ICD-10-CM | POA: Diagnosis not present

## 2022-04-03 DIAGNOSIS — B449 Aspergillosis, unspecified: Secondary | ICD-10-CM | POA: Diagnosis not present

## 2022-04-03 DIAGNOSIS — R197 Diarrhea, unspecified: Secondary | ICD-10-CM | POA: Diagnosis not present

## 2022-04-03 DIAGNOSIS — Z6824 Body mass index (BMI) 24.0-24.9, adult: Secondary | ICD-10-CM | POA: Diagnosis not present

## 2022-04-03 DIAGNOSIS — R1110 Vomiting, unspecified: Secondary | ICD-10-CM | POA: Diagnosis not present

## 2022-04-07 DIAGNOSIS — K2270 Barrett's esophagus without dysplasia: Secondary | ICD-10-CM | POA: Diagnosis not present

## 2022-04-07 DIAGNOSIS — K648 Other hemorrhoids: Secondary | ICD-10-CM | POA: Diagnosis not present

## 2022-04-07 DIAGNOSIS — R918 Other nonspecific abnormal finding of lung field: Secondary | ICD-10-CM | POA: Diagnosis not present

## 2022-04-07 DIAGNOSIS — K2100 Gastro-esophageal reflux disease with esophagitis, without bleeding: Secondary | ICD-10-CM | POA: Diagnosis not present

## 2022-04-07 DIAGNOSIS — R1310 Dysphagia, unspecified: Secondary | ICD-10-CM | POA: Diagnosis not present

## 2022-04-10 DIAGNOSIS — R918 Other nonspecific abnormal finding of lung field: Secondary | ICD-10-CM | POA: Diagnosis not present

## 2022-04-10 DIAGNOSIS — G4733 Obstructive sleep apnea (adult) (pediatric): Secondary | ICD-10-CM | POA: Diagnosis not present

## 2022-04-10 DIAGNOSIS — F17210 Nicotine dependence, cigarettes, uncomplicated: Secondary | ICD-10-CM | POA: Diagnosis not present

## 2022-04-10 DIAGNOSIS — J449 Chronic obstructive pulmonary disease, unspecified: Secondary | ICD-10-CM | POA: Diagnosis not present

## 2022-04-10 DIAGNOSIS — K219 Gastro-esophageal reflux disease without esophagitis: Secondary | ICD-10-CM | POA: Diagnosis not present

## 2022-05-01 DIAGNOSIS — R918 Other nonspecific abnormal finding of lung field: Secondary | ICD-10-CM | POA: Diagnosis not present

## 2022-05-01 DIAGNOSIS — F17210 Nicotine dependence, cigarettes, uncomplicated: Secondary | ICD-10-CM | POA: Diagnosis not present

## 2022-05-01 DIAGNOSIS — K219 Gastro-esophageal reflux disease without esophagitis: Secondary | ICD-10-CM | POA: Diagnosis not present

## 2022-05-01 DIAGNOSIS — G4733 Obstructive sleep apnea (adult) (pediatric): Secondary | ICD-10-CM | POA: Diagnosis not present

## 2022-05-01 DIAGNOSIS — J449 Chronic obstructive pulmonary disease, unspecified: Secondary | ICD-10-CM | POA: Diagnosis not present

## 2022-05-15 DIAGNOSIS — R918 Other nonspecific abnormal finding of lung field: Secondary | ICD-10-CM | POA: Diagnosis not present

## 2022-05-15 DIAGNOSIS — F17210 Nicotine dependence, cigarettes, uncomplicated: Secondary | ICD-10-CM | POA: Diagnosis not present

## 2022-05-15 DIAGNOSIS — K219 Gastro-esophageal reflux disease without esophagitis: Secondary | ICD-10-CM | POA: Diagnosis not present

## 2022-05-15 DIAGNOSIS — K2100 Gastro-esophageal reflux disease with esophagitis, without bleeding: Secondary | ICD-10-CM | POA: Diagnosis not present

## 2022-05-15 DIAGNOSIS — J449 Chronic obstructive pulmonary disease, unspecified: Secondary | ICD-10-CM | POA: Diagnosis not present

## 2022-05-15 DIAGNOSIS — K648 Other hemorrhoids: Secondary | ICD-10-CM | POA: Diagnosis not present

## 2022-05-15 DIAGNOSIS — K2270 Barrett's esophagus without dysplasia: Secondary | ICD-10-CM | POA: Diagnosis not present

## 2022-05-15 DIAGNOSIS — G4733 Obstructive sleep apnea (adult) (pediatric): Secondary | ICD-10-CM | POA: Diagnosis not present

## 2022-05-16 DIAGNOSIS — R55 Syncope and collapse: Secondary | ICD-10-CM | POA: Diagnosis not present

## 2022-05-16 DIAGNOSIS — R051 Acute cough: Secondary | ICD-10-CM | POA: Diagnosis not present

## 2022-05-16 DIAGNOSIS — R918 Other nonspecific abnormal finding of lung field: Secondary | ICD-10-CM | POA: Diagnosis not present

## 2022-05-16 DIAGNOSIS — F17210 Nicotine dependence, cigarettes, uncomplicated: Secondary | ICD-10-CM | POA: Diagnosis not present

## 2022-05-16 DIAGNOSIS — K219 Gastro-esophageal reflux disease without esophagitis: Secondary | ICD-10-CM | POA: Diagnosis not present

## 2022-05-16 DIAGNOSIS — J449 Chronic obstructive pulmonary disease, unspecified: Secondary | ICD-10-CM | POA: Diagnosis not present

## 2022-05-26 IMAGING — DX DG PORTABLE PELVIS
1 series · 1 of 1 positions shown · non-contrast
Comparison: None.

CLINICAL DATA: Postoperative, status post right hip total
arthroplasty

EXAM:
PORTABLE PELVIS 1-2 VIEWS

[pelvis ap]
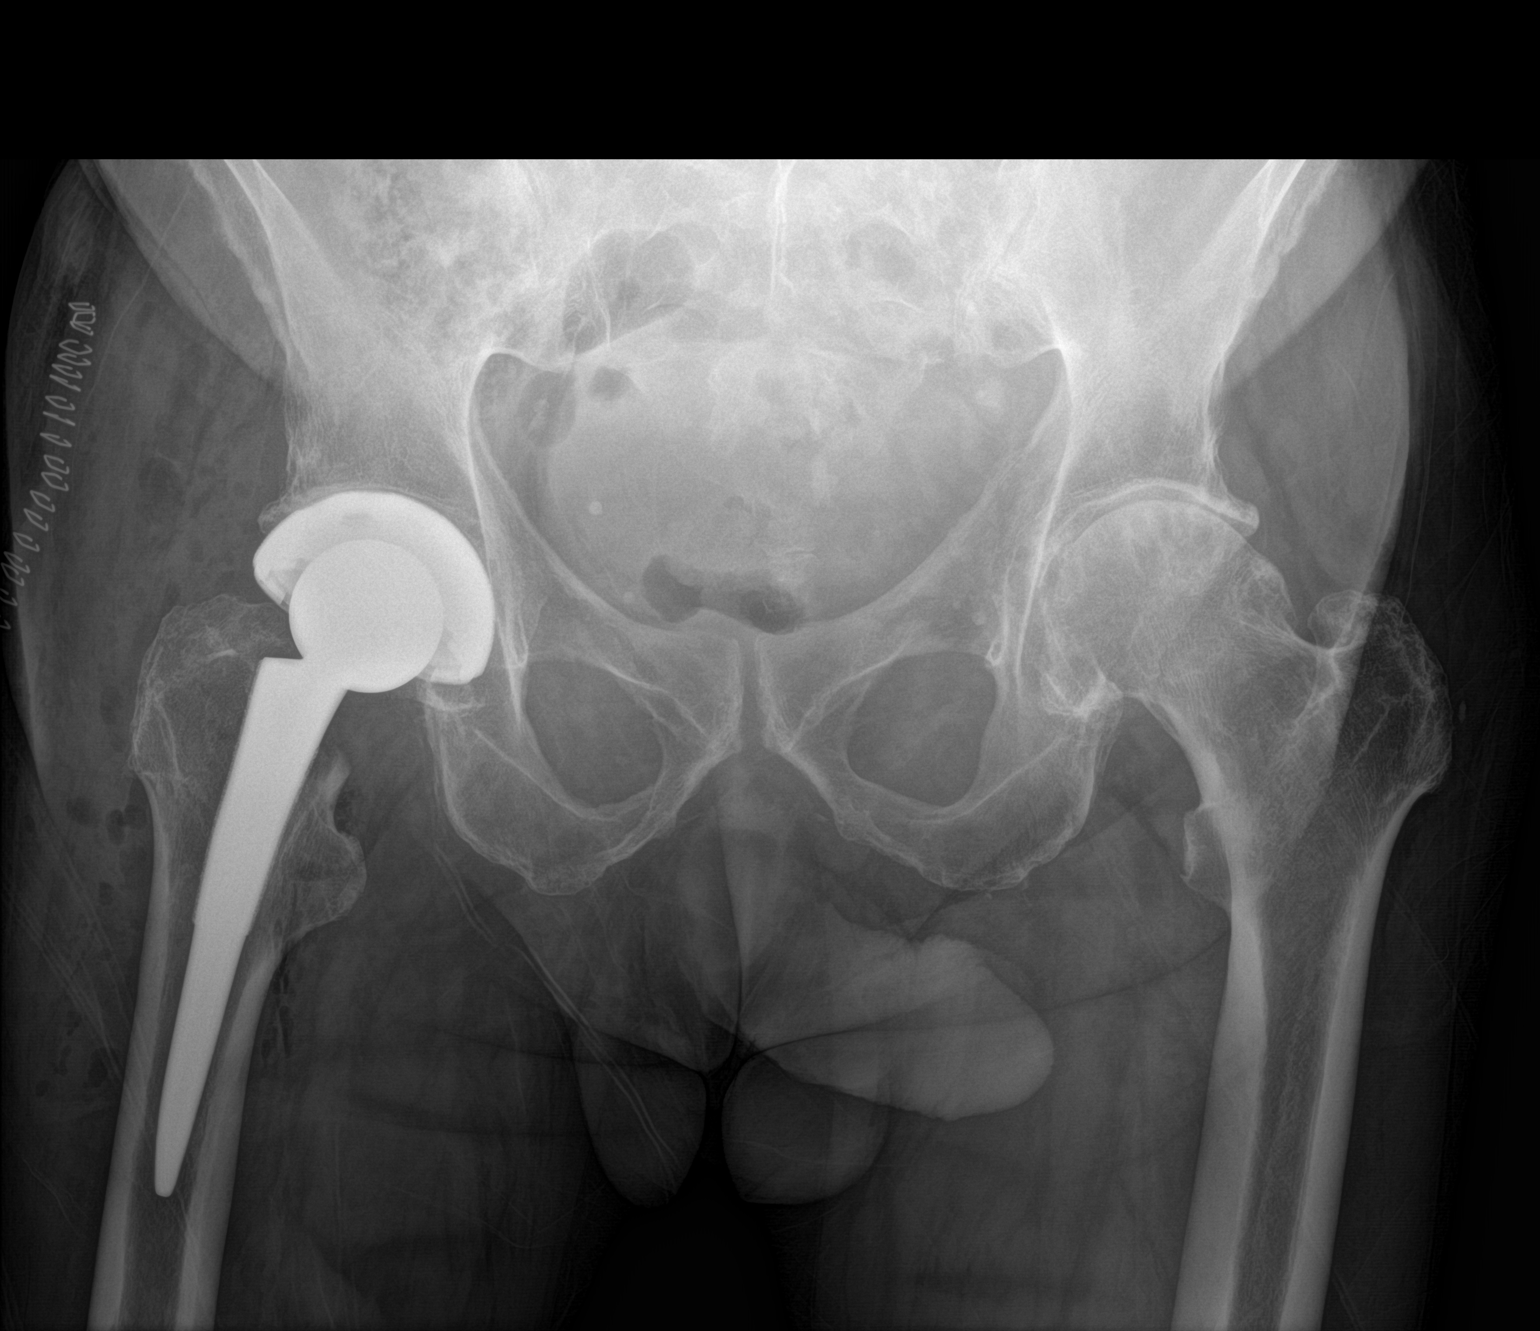

[1 of 1 positions shown; findings below may reference images not displayed]

FINDINGS: Status post right hip total arthroplasty with expected overlying
postoperative changes. No evidence of component malpositioning or
perihardware fracture. Moderate to severe arthrosis of the included
left hip.
IMPRESSION: 1. Status post right hip total arthroplasty with expected overlying
postoperative changes. No evidence of component malpositioning or
perihardware fracture.
2. Moderate to severe arthrosis of the included left hip.

## 2022-05-30 DIAGNOSIS — E785 Hyperlipidemia, unspecified: Secondary | ICD-10-CM | POA: Diagnosis not present

## 2022-05-30 DIAGNOSIS — Z9181 History of falling: Secondary | ICD-10-CM | POA: Diagnosis not present

## 2022-05-30 DIAGNOSIS — Z0000 Encounter for general adult medical examination without abnormal findings: Secondary | ICD-10-CM | POA: Diagnosis not present

## 2022-06-05 DIAGNOSIS — K219 Gastro-esophageal reflux disease without esophagitis: Secondary | ICD-10-CM | POA: Diagnosis not present

## 2022-06-05 DIAGNOSIS — F17210 Nicotine dependence, cigarettes, uncomplicated: Secondary | ICD-10-CM | POA: Diagnosis not present

## 2022-06-05 DIAGNOSIS — G4733 Obstructive sleep apnea (adult) (pediatric): Secondary | ICD-10-CM | POA: Diagnosis not present

## 2022-06-05 DIAGNOSIS — R918 Other nonspecific abnormal finding of lung field: Secondary | ICD-10-CM | POA: Diagnosis not present

## 2022-06-05 DIAGNOSIS — J449 Chronic obstructive pulmonary disease, unspecified: Secondary | ICD-10-CM | POA: Diagnosis not present

## 2022-06-23 DIAGNOSIS — I252 Old myocardial infarction: Secondary | ICD-10-CM | POA: Diagnosis not present

## 2022-06-23 DIAGNOSIS — E782 Mixed hyperlipidemia: Secondary | ICD-10-CM | POA: Diagnosis not present

## 2022-06-23 DIAGNOSIS — K2270 Barrett's esophagus without dysplasia: Secondary | ICD-10-CM

## 2022-06-23 DIAGNOSIS — Z951 Presence of aortocoronary bypass graft: Secondary | ICD-10-CM | POA: Diagnosis not present

## 2022-06-23 DIAGNOSIS — Z955 Presence of coronary angioplasty implant and graft: Secondary | ICD-10-CM | POA: Diagnosis not present

## 2022-06-23 DIAGNOSIS — R0789 Other chest pain: Secondary | ICD-10-CM | POA: Diagnosis not present

## 2022-06-23 DIAGNOSIS — I2510 Atherosclerotic heart disease of native coronary artery without angina pectoris: Secondary | ICD-10-CM | POA: Diagnosis not present

## 2022-06-23 DIAGNOSIS — Z7901 Long term (current) use of anticoagulants: Secondary | ICD-10-CM | POA: Diagnosis not present

## 2022-06-23 DIAGNOSIS — Z86711 Personal history of pulmonary embolism: Secondary | ICD-10-CM | POA: Diagnosis not present

## 2022-06-23 HISTORY — DX: Barrett's esophagus without dysplasia: K22.70

## 2022-07-24 DIAGNOSIS — I25110 Atherosclerotic heart disease of native coronary artery with unstable angina pectoris: Secondary | ICD-10-CM | POA: Diagnosis not present

## 2022-07-24 DIAGNOSIS — K2100 Gastro-esophageal reflux disease with esophagitis, without bleeding: Secondary | ICD-10-CM | POA: Diagnosis not present

## 2022-07-24 DIAGNOSIS — I440 Atrioventricular block, first degree: Secondary | ICD-10-CM | POA: Diagnosis not present

## 2022-07-24 DIAGNOSIS — R9431 Abnormal electrocardiogram [ECG] [EKG]: Secondary | ICD-10-CM | POA: Diagnosis not present

## 2022-07-24 DIAGNOSIS — K5790 Diverticulosis of intestine, part unspecified, without perforation or abscess without bleeding: Secondary | ICD-10-CM | POA: Diagnosis not present

## 2022-07-24 DIAGNOSIS — I252 Old myocardial infarction: Secondary | ICD-10-CM | POA: Diagnosis not present

## 2022-07-24 DIAGNOSIS — I214 Non-ST elevation (NSTEMI) myocardial infarction: Secondary | ICD-10-CM | POA: Diagnosis not present

## 2022-07-24 DIAGNOSIS — R079 Chest pain, unspecified: Secondary | ICD-10-CM | POA: Diagnosis not present

## 2022-07-24 DIAGNOSIS — I10 Essential (primary) hypertension: Secondary | ICD-10-CM | POA: Diagnosis not present

## 2022-07-24 DIAGNOSIS — K648 Other hemorrhoids: Secondary | ICD-10-CM | POA: Diagnosis not present

## 2022-07-24 DIAGNOSIS — K2270 Barrett's esophagus without dysplasia: Secondary | ICD-10-CM | POA: Diagnosis not present

## 2022-07-25 DIAGNOSIS — Z86711 Personal history of pulmonary embolism: Secondary | ICD-10-CM | POA: Diagnosis not present

## 2022-07-25 DIAGNOSIS — F17210 Nicotine dependence, cigarettes, uncomplicated: Secondary | ICD-10-CM | POA: Diagnosis not present

## 2022-07-25 DIAGNOSIS — E119 Type 2 diabetes mellitus without complications: Secondary | ICD-10-CM | POA: Diagnosis not present

## 2022-07-25 DIAGNOSIS — R079 Chest pain, unspecified: Secondary | ICD-10-CM | POA: Diagnosis not present

## 2022-07-25 DIAGNOSIS — I2510 Atherosclerotic heart disease of native coronary artery without angina pectoris: Secondary | ICD-10-CM | POA: Diagnosis not present

## 2022-07-25 DIAGNOSIS — I25110 Atherosclerotic heart disease of native coronary artery with unstable angina pectoris: Secondary | ICD-10-CM | POA: Diagnosis not present

## 2022-07-25 DIAGNOSIS — E785 Hyperlipidemia, unspecified: Secondary | ICD-10-CM | POA: Diagnosis not present

## 2022-07-25 DIAGNOSIS — R9431 Abnormal electrocardiogram [ECG] [EKG]: Secondary | ICD-10-CM | POA: Diagnosis not present

## 2022-07-25 DIAGNOSIS — J449 Chronic obstructive pulmonary disease, unspecified: Secondary | ICD-10-CM | POA: Diagnosis not present

## 2022-07-25 DIAGNOSIS — Z8673 Personal history of transient ischemic attack (TIA), and cerebral infarction without residual deficits: Secondary | ICD-10-CM | POA: Diagnosis not present

## 2022-07-25 DIAGNOSIS — Z87440 Personal history of urinary (tract) infections: Secondary | ICD-10-CM | POA: Diagnosis not present

## 2022-07-25 DIAGNOSIS — I252 Old myocardial infarction: Secondary | ICD-10-CM | POA: Diagnosis not present

## 2022-07-25 DIAGNOSIS — Z7982 Long term (current) use of aspirin: Secondary | ICD-10-CM | POA: Diagnosis not present

## 2022-07-25 DIAGNOSIS — I214 Non-ST elevation (NSTEMI) myocardial infarction: Secondary | ICD-10-CM | POA: Diagnosis not present

## 2022-07-25 DIAGNOSIS — Z79899 Other long term (current) drug therapy: Secondary | ICD-10-CM | POA: Diagnosis not present

## 2022-07-25 DIAGNOSIS — Z951 Presence of aortocoronary bypass graft: Secondary | ICD-10-CM | POA: Diagnosis not present

## 2022-07-25 DIAGNOSIS — E039 Hypothyroidism, unspecified: Secondary | ICD-10-CM | POA: Diagnosis not present

## 2022-07-25 DIAGNOSIS — I10 Essential (primary) hypertension: Secondary | ICD-10-CM | POA: Diagnosis not present

## 2022-07-25 DIAGNOSIS — Z7901 Long term (current) use of anticoagulants: Secondary | ICD-10-CM | POA: Diagnosis not present

## 2022-07-25 DIAGNOSIS — I440 Atrioventricular block, first degree: Secondary | ICD-10-CM | POA: Diagnosis not present

## 2022-07-26 DIAGNOSIS — E785 Hyperlipidemia, unspecified: Secondary | ICD-10-CM | POA: Diagnosis not present

## 2022-07-26 DIAGNOSIS — I2510 Atherosclerotic heart disease of native coronary artery without angina pectoris: Secondary | ICD-10-CM | POA: Diagnosis not present

## 2022-07-26 DIAGNOSIS — I10 Essential (primary) hypertension: Secondary | ICD-10-CM | POA: Diagnosis not present

## 2022-07-26 DIAGNOSIS — I25110 Atherosclerotic heart disease of native coronary artery with unstable angina pectoris: Secondary | ICD-10-CM | POA: Diagnosis not present

## 2022-07-26 DIAGNOSIS — I214 Non-ST elevation (NSTEMI) myocardial infarction: Secondary | ICD-10-CM | POA: Diagnosis not present

## 2022-07-28 DIAGNOSIS — R918 Other nonspecific abnormal finding of lung field: Secondary | ICD-10-CM | POA: Diagnosis not present

## 2022-07-31 DIAGNOSIS — F17210 Nicotine dependence, cigarettes, uncomplicated: Secondary | ICD-10-CM | POA: Diagnosis not present

## 2022-07-31 DIAGNOSIS — K219 Gastro-esophageal reflux disease without esophagitis: Secondary | ICD-10-CM | POA: Diagnosis not present

## 2022-07-31 DIAGNOSIS — G4733 Obstructive sleep apnea (adult) (pediatric): Secondary | ICD-10-CM | POA: Diagnosis not present

## 2022-07-31 DIAGNOSIS — J449 Chronic obstructive pulmonary disease, unspecified: Secondary | ICD-10-CM | POA: Diagnosis not present

## 2022-07-31 DIAGNOSIS — R918 Other nonspecific abnormal finding of lung field: Secondary | ICD-10-CM | POA: Diagnosis not present

## 2022-08-04 DIAGNOSIS — J439 Emphysema, unspecified: Secondary | ICD-10-CM | POA: Insufficient documentation

## 2022-08-04 DIAGNOSIS — D6861 Antiphospholipid syndrome: Secondary | ICD-10-CM

## 2022-08-04 DIAGNOSIS — I209 Angina pectoris, unspecified: Secondary | ICD-10-CM | POA: Insufficient documentation

## 2022-08-04 DIAGNOSIS — E893 Postprocedural hypopituitarism: Secondary | ICD-10-CM

## 2022-08-04 HISTORY — DX: Angina pectoris, unspecified: I20.9

## 2022-08-04 HISTORY — DX: Postprocedural hypopituitarism: E89.3

## 2022-08-04 HISTORY — DX: Antiphospholipid syndrome: D68.61

## 2022-08-04 HISTORY — DX: Emphysema, unspecified: J43.9

## 2022-08-05 ENCOUNTER — Ambulatory Visit: Payer: Medicare Other | Attending: Cardiology | Admitting: Cardiology

## 2022-08-05 ENCOUNTER — Encounter: Payer: Self-pay | Admitting: Cardiology

## 2022-08-05 ENCOUNTER — Telehealth: Payer: Self-pay | Admitting: *Deleted

## 2022-08-05 VITALS — BP 110/70 | HR 68 | Ht 69.0 in | Wt 189.0 lb

## 2022-08-05 DIAGNOSIS — R079 Chest pain, unspecified: Secondary | ICD-10-CM | POA: Diagnosis not present

## 2022-08-05 DIAGNOSIS — J431 Panlobular emphysema: Secondary | ICD-10-CM

## 2022-08-05 DIAGNOSIS — K2270 Barrett's esophagus without dysplasia: Secondary | ICD-10-CM | POA: Diagnosis not present

## 2022-08-05 DIAGNOSIS — I10 Essential (primary) hypertension: Secondary | ICD-10-CM

## 2022-08-05 DIAGNOSIS — Z951 Presence of aortocoronary bypass graft: Secondary | ICD-10-CM

## 2022-08-05 DIAGNOSIS — F17200 Nicotine dependence, unspecified, uncomplicated: Secondary | ICD-10-CM

## 2022-08-05 DIAGNOSIS — Z86711 Personal history of pulmonary embolism: Secondary | ICD-10-CM

## 2022-08-05 DIAGNOSIS — E119 Type 2 diabetes mellitus without complications: Secondary | ICD-10-CM

## 2022-08-05 DIAGNOSIS — IMO0001 Reserved for inherently not codable concepts without codable children: Secondary | ICD-10-CM

## 2022-08-05 HISTORY — DX: Nicotine dependence, unspecified, uncomplicated: F17.200

## 2022-08-05 HISTORY — DX: Reserved for inherently not codable concepts without codable children: IMO0001

## 2022-08-05 MED ORDER — RANOLAZINE ER 1000 MG PO TB12: 1000.0000 mg | ORAL_TABLET | Freq: Two times a day (BID) | ORAL | 3 refills | Status: DC

## 2022-08-05 MED ORDER — CLOPIDOGREL BISULFATE 75 MG PO TABS: 75.0000 mg | ORAL_TABLET | Freq: Every day | ORAL | 3 refills | Status: DC

## 2022-08-05 MED ORDER — NITROGLYCERIN 0.4 MG SL SUBL: 0.4000 mg | SUBLINGUAL_TABLET | SUBLINGUAL | 6 refills | Status: DC | PRN

## 2022-08-05 NOTE — Patient Instructions (Addendum)
Medication Instructions:  Your physician has recommended you make the following change in your medication:   STOP - Aspirin  START: Plavix '75mg'$  1 tablet daily  Ranolazine '1000mg'$  twice daily- sent to pharmacy     Lab Work: None Ordered If you have labs (blood work) drawn today and your tests are completely normal, you will receive your results only by: [place] (if you have MyChart) OR A paper copy in the mail If you have any lab test that is abnormal or we need to change your treatment, we will call you to review the results.   Testing/Procedures: Your physician has requested that you have a lexiscan myoview. For further information please visit [email or web address]. Please follow instruction sheet, as given.  The test will take approximately 3 to 4 hours to complete; you may bring reading material.  If someone comes with you to your appointment, they will need to remain in the main lobby due to limited space in the testing area.   How to prepare for your Myocardial Perfusion Test: Do not eat or drink 3 hours prior to your test, except you may have water. Do not consume products containing caffeine (regular or decaffeinated) 12 hours prior to your test. (ex: coffee, chocolate, sodas, tea). Do bring a list of your current medications with you.  If not listed below, you may take your medications as normal. Do wear comfortable clothes (no dresses or overalls) and walking shoes, tennis shoes preferred (No heels or open toe shoes are allowed). Do NOT wear cologne, perfume, aftershave, or lotions (deodorant is allowed). If these instructions are not followed, your test will have to be rescheduled.     Follow-Up: At [facility], you and your health needs are our priority.  As part of our continuing mission to provide you with exceptional heart care, we have created designated Provider Care Teams.  These Care Teams include your primary Cardiologist (physician) and Advanced  Practice Providers (APPs -  Physician Assistants and Nurse Practitioners) who all work together to provide you with the care you need, when you need it.  We recommend signing up for the patient portal called "MyChart".  Sign up information is provided on this After Visit Summary.  MyChart is used to connect with patients for Virtual Visits (Telemedicine).  Patients are able to view lab/test results, encounter notes, upcoming appointments, etc.  Non-urgent messages can be sent to your provider as well.   To learn more about what you can do with MyChart, go to [email or web address].    Your next appointment:   2 month(s)  The format for your next appointment:   In Person  Provider:   Jenne Campus, MD    Other Instructions NA

## 2022-08-05 NOTE — Telephone Encounter (Signed)
Patient given detailed instructions per Myocardial Perfusion Study Information Sheet for the test on 08/07/22 at 0800. Patient notified to arrive 15 minutes early and that it is imperative to arrive on time for appointment to keep from having the test rescheduled.  If you need to cancel or reschedule your appointment, please call the office within 24 hours of your appointment. . Patient verbalized understanding.Matthew Adams, Ranae Palms

## 2022-08-05 NOTE — Progress Notes (Unsigned)
Cardiology Consultation:    Date:  08/05/2022   ID:  Matthew SCHELLENBERG, DOB August 22, 1961, MRN [id number]  PCP:  Nicoletta Dress, MD  Cardiologist:  Jenne Campus, MD   Referring MD: Nicoletta Dress, MD   Chief Complaint  Patient presents with   F/u heart attack     Seen at [facility]    History of Present Illness:    Matthew Adams is a 61 y.o. male who is being seen today for the evaluation of coronary artery disease at the request of Nicoletta Dress, MD. past medical history significant for coronary artery disease, in 2016 he did have PTCA and stenting done, in 2014 he had coronary artery bypass graft x4 with sequential SVG to obtuse marginal 1, PDA, SVG to LAD, free LIMA to diagonal 1 as Y graft of SVG.  In September 2022 he had cardiac catheterization done which showed completely occluded free LIMA as well as SVG to LAD.  Additional problems include COPD, history of smoking, history of pulmonary emboli, he is on long-term anticoagulation, dyslipidemia, smoking which sadly still ongoing. Recently he ended up being admitted to [facility] because of prolonged episode of chest pain, he did have a spell of troponin with the highest #2.41, echocardiogram has been performed which showed ejection fraction 45 to 50% with hypokinesis involving LAD territory.  He is being treated medically, he is coming today to my office for follow-up.  He denies have any chest pain tightness squeezing pressure burning chest.  She still continues to smoke.  He is trying to be active but he gets short of breath quite easily  Past Medical History:  Diagnosis Date   Abnormal ECG 08/09/2020   Abnormal nuclear stress test 12/25/2017   Angina pectoris ([place]) 08/04/2022   Antiphospholipid antibody syndrome ([place]) 08/04/2022   Formatting of this note might be different from the original. On chronic anticoagulation. Last PE 2011   Arthritis    hips   Atherosclerotic heart disease of native coronary artery  without angina pectoris 05/18/2013   Formatting of this note might be different from the original. 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 COPD, FEV1 is 54% predicted and DLCO is 70% predicted.   COPD, severe 12/30/2011   Coronary artery disease    Diabetes mellitus    Dyslipidemia 12/04/2015   Dyspnea    Elevated PSA 01/25/2015   Erectile dysfunction 02/14/2019   GERD (gastroesophageal reflux disease)    Heart disease    Coronary stents placed 2006   Hematuria 04/24/2015   Hematuria, microscopic 01/25/2015   History of acromegaly 01/05/2016   History of appendectomy    History of pneumonia 04/12/2013   Formatting of this note might be different from the original. s/p ATB   History of pulmonary embolism 12/30/2011   Hx of CABG 06/04/2015   Formatting of this note might be different from the original. 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 03/24/13 T4 = 1.7 (0.82-1.77)   Hypothyroidism    Long term current use of anticoagulant therapy 06/17/2017   Myocardial infarction [facility])    pt denies   Old MI (myocardial infarction) 07/05/2021   Pneumonia 07/2020   Preop cardiovascular exam 08/13/2020   Pulmonary cavitary lesion 12/30/2011   Pulmonary embolism ([place])    Secondary adrenal insufficiency ([place]) 08/05/2017   Secondary hypothyroidism 01/05/2016   Sleep apnea    Type 2 diabetes mellitus without complication  ([place]) 4/00/8676    Past Surgical History:  Procedure Laterality Date   APPENDECTOMY     CARDIAC CATHETERIZATION  2019   1 stent   CORONARY ARTERY BYPASS GRAFT  2014   TOTAL HIP ARTHROPLASTY Right 09/19/2020   Procedure: TOTAL HIP ARTHROPLASTY ANTERIOR APPROACH;  Surgeon: Rod Can, MD;  Location: [facility];  Service: Orthopedics;  Laterality: Right;   TRANSPHENOIDAL PITUITARY RESECTION  2003   recurrance treated with gamma knife    Current Medications: Current Meds  Medication Sig   albuterol (PROVENTIL) (2.5 MG/3ML) 0.083% nebulizer solution Take 2.5 mg by nebulization every 6 (six) hours as needed for wheezing or shortness of breath.   albuterol (VENTOLIN HFA) 108 (90 Base) MCG/ACT inhaler Inhale 1-2 puffs into the lungs every 6 (six) hours as needed for wheezing or shortness of breath.   atorvastatin (LIPITOR) 40 MG tablet Take 40 mg by mouth daily.   clopidogrel (PLAVIX) 75 MG tablet Take 1 tablet (75 mg total) by mouth daily.   docusate sodium (COLACE) 100 MG capsule Take 1 capsule (100 mg total) by mouth 2 (two) times daily.   Fluticasone-Salmeterol (ADVAIR) 500-50 MCG/DOSE AEPB Inhale 1 puff into the lungs 2 (two) times daily.   gabapentin (NEURONTIN) 300 MG capsule Take 300 mg by mouth at bedtime.   hydrocortisone (CORTEF) 10 MG tablet Take 5-10 mg by mouth See admin instructions. Take 10 mg by mouth in the morning and 5 mg in the evening   levothyroxine (SYNTHROID) 100 MCG tablet Take 100 mcg by mouth daily before breakfast.   metoprolol tartrate (LOPRESSOR) 25 MG tablet Take 12.5 mg by mouth daily.   pantoprazole (PROTONIX) 40 MG tablet Take 40 mg by mouth at bedtime.   ranolazine (RANEXA) 1000 MG SR tablet Take 1 tablet (1,000 mg total) by mouth 2 (two) times daily.   senna (SENOKOT) 8.6 MG TABS tablet Take 2 tablets (17.2 mg total) by mouth at bedtime.   sildenafil (REVATIO) 20 MG tablet Take 20-100 mg by mouth daily as needed for erectile dysfunction.   simvastatin  (ZOCOR) 40 MG tablet Take 40 mg by mouth daily.   SPIRIVA RESPIMAT 2.5 MCG/ACT AERS Inhale 1 puff into the lungs in the morning and at bedtime.   tamsulosin (FLOMAX) 0.4 MG CAPS capsule Take 0.4 mg by mouth at bedtime.    Testosterone 20.25 MG/ACT (1.62%) GEL Apply 3 Pump topically at bedtime. AFTER SHOWERING   XARELTO 15 MG TABS tablet Take 15 mg by mouth daily after supper.   [DISCONTINUED] montelukast (SINGULAIR) 10 MG tablet Take 10 mg by mouth daily.   [DISCONTINUED] nitroGLYCERIN (NITROSTAT) 0.4 MG SL tablet Place 0.4 mg under the tongue every 5 (five) minutes x 3 doses as needed for chest pain.   [DISCONTINUED] ranolazine (RANEXA) 500 MG 12 hr tablet Take 500 mg by mouth 2 (two) times daily.     Allergies:   Patient has no known allergies.   Social History  Socioeconomic History   Marital status: Divorced    Spouse name: Not on file   Number of children: Not on file   Years of education: Not on file   Highest education level: Not on file  Occupational History   Not on file  Tobacco Use   Smoking status: Every Day    Packs/day: 0.25    Years: 25.00    Total pack years: 6.25    Types: Cigarettes    Last attempt to quit: 11/10/2011    Years since quitting: 10.7   Smokeless tobacco: Never  Vaping Use   Vaping Use: Never used  Substance and Sexual Activity   Alcohol use: No   Drug use: No   Sexual activity: Not on file  Other Topics Concern   Not on file  Social History Narrative   Not on file   Social Determinants of Health   Financial Resource Strain: Not on file  Food Insecurity: Not on file  Transportation Needs: Not on file  Physical Activity: Not on file  Stress: Not on file  Social Connections: Not on file     Family History: The patient's family history includes Heart disease in his father. ROS:   Please see the history of present illness.    All 14 point review of systems negative except as described per history of present illness.  EKGs/Labs/Other  Studies Reviewed:    The following studies were reviewed today: Echocardiogram reviewed from hospital showing ejection fraction 45 to 50% with distal anterior, anteroseptal and inferior septal and anterolateral and apex hypokinetic  EKG:  EKG is  ordered today.  The ekg ordered today demonstrates sinus rhythm first-degree AV block left axis deviation, Q waves in V1 V2 V3 raising suspicion for anteroseptal wall MI  Recent Labs: No results found for requested labs within last 365 days.  Recent Lipid Panel No results found for: "CHOL", "TRIG", "HDL", "CHOLHDL", "VLDL", "LDLCALC", "LDLDIRECT"  Physical Exam:    VS:  BP 110/70 (BP Location: Left Arm, Patient Position: Sitting)   Pulse 68   Ht '5\' 9"'$  (1.753 m)   Wt 189 lb (85.7 kg)   SpO2 94%   BMI 27.91 kg/m     Wt Readings from Last 3 Encounters:  08/05/22 189 lb (85.7 kg)  05/30/21 180 lb 3.2 oz (81.7 kg)  09/19/20 191 lb 5.8 oz (86.8 kg)     GEN:  Well nourished, well developed in no acute distress HEENT: Normal NECK: No JVD; No carotid bruits LYMPHATICS: No lymphadenopathy CARDIAC: RRR, no murmurs, no rubs, no gallops RESPIRATORY:  Clear to auscultation without rales, bilateral wheezes and rhonchi ABDOMEN: Soft, non-tender, non-distended MUSCULOSKELETAL:  No edema; No deformity  SKIN: Warm and dry NEUROLOGIC:  Alert and oriented x 3 PSYCHIATRIC:  Normal affect   ASSESSMENT:    1. Hypertension, unspecified type   2. Chest pain of uncertain etiology   3. Hx of CABG   4. History of pulmonary embolism   5. Type 2 diabetes mellitus without complication, without long-term current use of insulin (HCC)   6. Barrett's esophagus determined by endoscopy   7. Panlobular emphysema ([place])   8. Smoking    PLAN:    In order of problems listed above:  Coronary artery disease status post recent non-STEMI.  He did have a cardiac catheterization done in September of last year which revealed completely occluded graft going to LAD as  well as diagonal.  I do not have official report of the test  I suspect LAD was also occluded as well.  He recently was admitted to the hospital but with prolonged episode of chest pain.  He was put on appropriate guideline directed medical therapy since that time he seems to be asymptomatic there was also some question about potentially having GI symptoms he does have Barrett's esophagus some of his symptomatology was addressed however again since the time he is being discharged from hospital there is no more issues I do long discussion with him about what to do with the situation.  The plan will be to do stress test trying to figure out where is the area of ischemia that he is having chest pain from.  That will help [place] to determine what could be next step if we fail with medical therapy.  In terms of medical therapy I will switch him from aspirin to Plavix he will be on Plavix as well as Xarelto, will continue with ranolazine 1000 mg twice daily, there is no role for beta-blocker because of first-degree AV block, he does have relatively low blood pressure therefore I prefer to avoid long-acting nitroglycerin.   Cardiomyopathy ischemic last echocardiogram ejection fraction 45-50.  He is on maximal tolerated medications right now.  In the future we will try to augment the therapy.  The problem is his blood pressure being low COPD/emphysema/smoking obviously huge problem he understand he is try to work on quitting I strongly encouraged him to do that Barrett's esophagus, that being followed by GI.  Apparently he is supposed to have a gastroscopy done however it was not done interestingly the day that he saw his gastroenterologist in the coming to the hospital with non-STEMI.  Will evaluate him for possibility of this procedure after things quiet down. Dyslipidemia he is on high intensity statin Lipitor 40 we will make arrangements for cholesterol to be checked.  Laboratory test done in the hospital show HDL of 30  LDL at 107.  Most likely will increase dose of Lipitor   Medication Adjustments/Labs and Tests Ordered: Current medicines are reviewed at length with the patient today.  Concerns regarding medicines are outlined above.  Orders Placed This Encounter  Procedures   MYOCARDIAL PERFUSION IMAGING   EKG 12-Lead   Meds ordered this encounter  Medications   clopidogrel (PLAVIX) 75 MG tablet    Sig: Take 1 tablet (75 mg total) by mouth daily.    Dispense:  90 tablet    Refill:  3   ranolazine (RANEXA) 1000 MG SR tablet    Sig: Take 1 tablet (1,000 mg total) by mouth 2 (two) times daily.    Dispense:  180 tablet    Refill:  3   nitroGLYCERIN (NITROSTAT) 0.4 MG SL tablet    Sig: Place 1 tablet (0.4 mg total) under the tongue every 5 (five) minutes x 3 doses as needed for chest pain.    Dispense:  25 tablet    Refill:  6    Signed, Park Liter, MD, [facility]. 08/05/2022 11:55 AM    [place]

## 2022-08-06 DIAGNOSIS — Z23 Encounter for immunization: Secondary | ICD-10-CM | POA: Diagnosis not present

## 2022-08-06 DIAGNOSIS — H6691 Otitis media, unspecified, right ear: Secondary | ICD-10-CM | POA: Diagnosis not present

## 2022-08-06 DIAGNOSIS — I214 Non-ST elevation (NSTEMI) myocardial infarction: Secondary | ICD-10-CM | POA: Diagnosis not present

## 2022-08-06 DIAGNOSIS — J439 Emphysema, unspecified: Secondary | ICD-10-CM | POA: Diagnosis not present

## 2022-08-06 DIAGNOSIS — I700 Atherosclerosis of aorta: Secondary | ICD-10-CM | POA: Diagnosis not present

## 2022-08-07 ENCOUNTER — Ambulatory Visit: Payer: Medicare Other | Attending: Cardiology

## 2022-08-07 DIAGNOSIS — R079 Chest pain, unspecified: Secondary | ICD-10-CM | POA: Diagnosis not present

## 2022-08-07 MED ORDER — TECHNETIUM TC 99M TETROFOSMIN IV KIT
10.9000 | PACK | Freq: Once | INTRAVENOUS | Status: AC | PRN
  Administered 2022-08-07: 10.9 via INTRAVENOUS

## 2022-08-07 MED ORDER — TECHNETIUM TC 99M TETROFOSMIN IV KIT
32.4000 | PACK | Freq: Once | INTRAVENOUS | Status: AC | PRN
  Administered 2022-08-07: 32.4 via INTRAVENOUS

## 2022-08-07 MED ORDER — REGADENOSON 0.4 MG/5ML IV SOLN
0.4000 mg | Freq: Once | INTRAVENOUS | Status: AC
  Administered 2022-08-07: 0.4 mg via INTRAVENOUS

## 2022-08-08 LAB — MYOCARDIAL PERFUSION IMAGING
LV dias vol: 163 mL (ref 62–150)
LV sys vol: 94 mL
Nuc Stress EF: 42 %
Peak HR: 80 {beats}/min
Rest HR: 60 {beats}/min
Rest Nuclear Isotope Dose: 10.9 mCi
SDS: 5
SRS: 16
SSS: 21
ST Depression (mm): 0 mm
Stress Nuclear Isotope Dose: 32.4 mCi
TID: 0.98

## 2022-08-12 ENCOUNTER — Telehealth: Payer: Self-pay

## 2022-08-12 NOTE — Telephone Encounter (Signed)
Refax recent office notes to Dr. Pamella Pert office (pt PCP)

## 2022-08-13 ENCOUNTER — Telehealth: Payer: Self-pay | Admitting: Cardiology

## 2022-08-13 NOTE — Telephone Encounter (Signed)
Pharmacy changed to [facility] [address]- per pt request

## 2022-08-13 NOTE — Telephone Encounter (Signed)
Pt would like to change his pharmacy to   [facility] [address] [place], [place], [place] [address]

## 2022-08-21 DIAGNOSIS — H6691 Otitis media, unspecified, right ear: Secondary | ICD-10-CM | POA: Diagnosis not present

## 2022-08-25 DIAGNOSIS — H60333 Swimmer's ear, bilateral: Secondary | ICD-10-CM | POA: Diagnosis not present

## 2022-08-26 ENCOUNTER — Telehealth: Payer: Self-pay

## 2022-08-26 NOTE — Telephone Encounter (Signed)
Results reviewed with pt as per Dr. Krasowski's note.  Pt verbalized understanding and had no additional questions. Routed to PCP  

## 2022-08-27 DIAGNOSIS — E893 Postprocedural hypopituitarism: Secondary | ICD-10-CM | POA: Diagnosis not present

## 2022-08-27 DIAGNOSIS — E038 Other specified hypothyroidism: Secondary | ICD-10-CM | POA: Diagnosis not present

## 2022-08-27 DIAGNOSIS — E2749 Other adrenocortical insufficiency: Secondary | ICD-10-CM | POA: Diagnosis not present

## 2022-09-09 DIAGNOSIS — H60332 Swimmer's ear, left ear: Secondary | ICD-10-CM | POA: Diagnosis not present

## 2022-10-09 ENCOUNTER — Encounter: Payer: Self-pay | Admitting: Cardiology

## 2022-10-09 ENCOUNTER — Ambulatory Visit: Payer: Medicare Other | Attending: Cardiology | Admitting: Cardiology

## 2022-10-09 VITALS — BP 100/66 | HR 64 | Ht 69.0 in | Wt 187.0 lb

## 2022-10-09 DIAGNOSIS — E119 Type 2 diabetes mellitus without complications: Secondary | ICD-10-CM | POA: Diagnosis not present

## 2022-10-09 DIAGNOSIS — J449 Chronic obstructive pulmonary disease, unspecified: Secondary | ICD-10-CM | POA: Diagnosis not present

## 2022-10-09 DIAGNOSIS — Z951 Presence of aortocoronary bypass graft: Secondary | ICD-10-CM | POA: Diagnosis not present

## 2022-10-09 DIAGNOSIS — B449 Aspergillosis, unspecified: Secondary | ICD-10-CM | POA: Diagnosis not present

## 2022-10-09 DIAGNOSIS — K219 Gastro-esophageal reflux disease without esophagitis: Secondary | ICD-10-CM | POA: Diagnosis not present

## 2022-10-09 DIAGNOSIS — I2510 Atherosclerotic heart disease of native coronary artery without angina pectoris: Secondary | ICD-10-CM

## 2022-10-09 DIAGNOSIS — F17210 Nicotine dependence, cigarettes, uncomplicated: Secondary | ICD-10-CM | POA: Diagnosis not present

## 2022-10-09 DIAGNOSIS — Z86711 Personal history of pulmonary embolism: Secondary | ICD-10-CM | POA: Diagnosis not present

## 2022-10-09 DIAGNOSIS — I209 Angina pectoris, unspecified: Secondary | ICD-10-CM

## 2022-10-09 DIAGNOSIS — R918 Other nonspecific abnormal finding of lung field: Secondary | ICD-10-CM | POA: Diagnosis not present

## 2022-10-09 DIAGNOSIS — G4733 Obstructive sleep apnea (adult) (pediatric): Secondary | ICD-10-CM | POA: Diagnosis not present

## 2022-10-09 NOTE — Patient Instructions (Signed)

## 2022-10-09 NOTE — Progress Notes (Signed)
Cardiology Office Note:    Date:  10/09/2022   ID:  Matthew Adams, DOB August 08, 1961, MRN [id number]  PCP:  Nicoletta Dress, MD  Cardiologist:  Jenne Campus, MD    Referring MD: Nicoletta Dress, MD   Chief Complaint  Patient presents with   Follow-up    History of Present Illness:    Matthew Adams is a 61 y.o. male   who is being seen today for the evaluation of coronary artery disease at the request of Nicoletta Dress, MD. past medical history significant for coronary artery disease, in 2016 he did have PTCA and stenting done, in 2014 he had coronary artery bypass graft x4 with sequential SVG to obtuse marginal 1, PDA, SVG to LAD, free LIMA to diagonal 1 as Y graft of SVG.  In September 2022 he had cardiac catheterization done which showed completely occluded free LIMA as well as SVG to LAD.  Additional problems include COPD, history of smoking, history of pulmonary emboli, he is on long-term anticoagulation, dyslipidemia, smoking which sadly still ongoing. Recently he ended up being admitted to [facility] because of prolonged episode of chest pain, he did have a spell of troponin with the highest #2.41, echocardiogram has been performed which showed ejection fraction 45 to 50% with hypokinesis involving LAD territory.  He is being treated medically,   In October he had stress test done which showed myocardial infarction but no ischemia. He is in my office today for follow-up overall doing well.  He said he had maybe 2 episode of chest pain 1 time he took nitroglycerin otherwise he still working hard to have no difficulty doing it.  Overall very happy the way he is. Past Medical History:  Diagnosis Date   Abnormal ECG 08/09/2020   Abnormal nuclear stress test 12/25/2017   Angina pectoris ([place]) 08/04/2022   Antiphospholipid antibody syndrome ([place]) 08/04/2022   Formatting of this note might be different from the original. On chronic anticoagulation. Last PE 2011    Arthritis    hips   Atherosclerotic heart disease of native coronary artery without angina pectoris 05/18/2013   Formatting of this note might be different from the original. 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 COPD, FEV1 is 54% predicted and DLCO is 70% predicted.   COPD, severe 12/30/2011   Coronary artery disease    Diabetes mellitus    Dyslipidemia 12/04/2015   Dyspnea    Elevated PSA 01/25/2015   Erectile dysfunction 02/14/2019   GERD (gastroesophageal reflux disease)    Heart disease    Coronary stents placed 2006   Hematuria 04/24/2015   Hematuria, microscopic 01/25/2015   History of acromegaly 01/05/2016   History of appendectomy    History of pneumonia 04/12/2013   Formatting of this note might be different from the original. s/p ATB   History of pulmonary embolism 12/30/2011   Hx of CABG 06/04/2015   Formatting of this note might be different from the original. 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 03/24/13 T4 = 1.7 (0.82-1.77)   Hypothyroidism    Long term current use of anticoagulant therapy 06/17/2017   Myocardial infarction [facility])    pt denies   Old MI (myocardial infarction) 07/05/2021   Pneumonia 07/2020   Preop cardiovascular exam 08/13/2020   Pulmonary cavitary lesion 12/30/2011   Pulmonary embolism ([place])    Secondary adrenal insufficiency ([place]) 08/05/2017   Secondary  hypothyroidism 01/05/2016   Sleep apnea    Type 2 diabetes mellitus without complication ([place]) 9/62/2297    Past Surgical History:  Procedure Laterality Date   APPENDECTOMY     CARDIAC CATHETERIZATION  2019   1 stent   CORONARY ARTERY BYPASS GRAFT  2014   TOTAL HIP ARTHROPLASTY Right 09/19/2020   Procedure: TOTAL HIP ARTHROPLASTY ANTERIOR APPROACH;  Surgeon: Rod Can, MD;  Location: [facility];  Service: Orthopedics;  Laterality: Right;   TRANSPHENOIDAL PITUITARY RESECTION  2003   recurrance treated with gamma knife    Current Medications: Current Meds  Medication Sig   albuterol (PROVENTIL) (2.5 MG/3ML) 0.083% nebulizer solution Take 2.5 mg by nebulization every 6 (six) hours as needed for wheezing or shortness of breath.   albuterol (VENTOLIN HFA) 108 (90 Base) MCG/ACT inhaler Inhale 1-2 puffs into the lungs every 6 (six) hours as needed for wheezing or shortness of breath.   atorvastatin (LIPITOR) 40 MG tablet Take 40 mg by mouth daily.   clopidogrel (PLAVIX) 75 MG tablet Take 1 tablet (75 mg total) by mouth daily.   docusate sodium (COLACE) 100 MG capsule Take 1 capsule (100 mg total) by mouth 2 (two) times daily.   Fluticasone-Salmeterol (ADVAIR) 500-50 MCG/DOSE AEPB Inhale 1 puff into the lungs 2 (two) times daily.   gabapentin (NEURONTIN) 300 MG capsule Take 300 mg by mouth at bedtime.   hydrocortisone (CORTEF) 10 MG tablet Take 5-10 mg by mouth See admin instructions. Take 10 mg by mouth in the morning and 5 mg in the evening   levothyroxine (SYNTHROID) 100 MCG tablet Take 100 mcg by mouth daily before breakfast.   metoprolol tartrate (LOPRESSOR) 25 MG tablet Take 12.5 mg by mouth daily.   nitroGLYCERIN (NITROSTAT) 0.4 MG SL tablet Place 1 tablet (0.4 mg total) under the tongue every 5 (five) minutes x 3 doses as needed for chest pain.   pantoprazole (PROTONIX) 40 MG tablet Take 40 mg by mouth at bedtime.   ranolazine (RANEXA) 1000 MG SR tablet Take 1 tablet (1,000 mg total)  by mouth 2 (two) times daily.   senna (SENOKOT) 8.6 MG TABS tablet Take 2 tablets (17.2 mg total) by mouth at bedtime.   sildenafil (REVATIO) 20 MG tablet Take 20-100 mg by mouth daily as needed for erectile dysfunction.   simvastatin (ZOCOR) 40 MG tablet Take 40 mg by mouth daily.   SPIRIVA RESPIMAT 2.5 MCG/ACT AERS Inhale 1 puff into the lungs in the morning and at bedtime.   tamsulosin (FLOMAX) 0.4 MG CAPS capsule Take 0.4 mg by mouth at bedtime.    Testosterone 20.25 MG/ACT (1.62%) GEL Apply 3 Pump topically at bedtime. AFTER SHOWERING   XARELTO 15 MG TABS tablet Take 15 mg by mouth daily after supper.     Allergies:   Patient has no known allergies.   Social History   Socioeconomic History   Marital status: Divorced    Spouse name: Not on file  Number of children: Not on file   Years of education: Not on file   Highest education level: Not on file  Occupational History   Not on file  Tobacco Use   Smoking status: Every Day    Packs/day: 0.25    Years: 25.00    Total pack years: 6.25    Types: Cigarettes    Last attempt to quit: 11/10/2011    Years since quitting: 10.9   Smokeless tobacco: Never  Vaping Use   Vaping Use: Never used  Substance and Sexual Activity   Alcohol use: No   Drug use: No   Sexual activity: Not on file  Other Topics Concern   Not on file  Social History Narrative   Not on file   Social Determinants of Health   Financial Resource Strain: Not on file  Food Insecurity: Not on file  Transportation Needs: Not on file  Physical Activity: Not on file  Stress: Not on file  Social Connections: Not on file     Family History: The patient's family history includes Heart disease in his father. ROS:   Please see the history of present illness.    All 14 point review of systems negative except as described per history of present illness  EKGs/Labs/Other Studies Reviewed:      Recent Labs: No results found for requested labs within last 365  days.  Recent Lipid Panel No results found for: "CHOL", "TRIG", "HDL", "CHOLHDL", "VLDL", "LDLCALC", "LDLDIRECT"  Physical Exam:    VS:  BP 100/66 (BP Location: Left Arm, Patient Position: Sitting, Cuff Size: Normal)   Pulse 64   Ht '5\' 9"'$  (1.753 m)   Wt 187 lb (84.8 kg)   SpO2 96%   BMI 27.62 kg/m     Wt Readings from Last 3 Encounters:  10/09/22 187 lb (84.8 kg)  08/07/22 189 lb (85.7 kg)  08/05/22 189 lb (85.7 kg)     GEN:  Well nourished, well developed in no acute distress HEENT: Normal NECK: No JVD; No carotid bruits LYMPHATICS: No lymphadenopathy CARDIAC: RRR, no murmurs, no rubs, no gallops RESPIRATORY:  Clear to auscultation without rales, wheezing or rhonchi  ABDOMEN: Soft, non-tender, non-distended MUSCULOSKELETAL:  No edema; No deformity  SKIN: Warm and dry LOWER EXTREMITIES: no swelling NEUROLOGIC:  Alert and oriented x 3 PSYCHIATRIC:  Normal affect   ASSESSMENT:    1. Atherosclerosis of native coronary artery of native heart without angina pectoris   2. Angina pectoris ([place])   3. Type 2 diabetes mellitus without complication, without long-term current use of insulin ([place])   4. Hx of CABG   5. History of pulmonary embolism    PLAN:    In order of problems listed above:  Coronary artery disease status post myocardial infarction involving LAD territory with occluded graft to LAD as well as diagonal.  Medical management.  Doing well.  Denies having very rare episode of angina.  I told him if he develop more episodes of angina he need to let me know.  Then will probably Imdur. Cardiomyopathy with diminished ejection fraction unable to tolerate medication because of low blood pressure.  Will continue monitoring we will try in the future to use some extra medication to help with that COPD that being managed by pulmonologist. History of pulmonary emboli he is anticoagulated which I will continue Dyslipidemia did review his K PN which show me his HDL 41 LDL 67 is  from April of this year we will continue present  management   Medication Adjustments/Labs and Tests Ordered: Current medicines are reviewed at length with the patient today.  Concerns regarding medicines are outlined above.  No orders of the defined types were placed in this encounter.  Medication changes: No orders of the defined types were placed in this encounter.   Signed, Park Liter, MD, [facility] 10/09/2022 4:22 PM    [place]

## 2022-11-10 DIAGNOSIS — E038 Other specified hypothyroidism: Secondary | ICD-10-CM | POA: Diagnosis not present

## 2022-11-10 DIAGNOSIS — R739 Hyperglycemia, unspecified: Secondary | ICD-10-CM | POA: Diagnosis not present

## 2022-11-10 DIAGNOSIS — I119 Hypertensive heart disease without heart failure: Secondary | ICD-10-CM | POA: Diagnosis not present

## 2022-11-10 DIAGNOSIS — E785 Hyperlipidemia, unspecified: Secondary | ICD-10-CM | POA: Diagnosis not present

## 2022-11-10 DIAGNOSIS — I700 Atherosclerosis of aorta: Secondary | ICD-10-CM | POA: Diagnosis not present

## 2022-11-10 DIAGNOSIS — D509 Iron deficiency anemia, unspecified: Secondary | ICD-10-CM | POA: Diagnosis not present

## 2022-11-10 LAB — LAB REPORT - SCANNED: EGFR: 96

## 2023-01-08 DIAGNOSIS — R918 Other nonspecific abnormal finding of lung field: Secondary | ICD-10-CM | POA: Diagnosis not present

## 2023-01-08 DIAGNOSIS — J449 Chronic obstructive pulmonary disease, unspecified: Secondary | ICD-10-CM | POA: Diagnosis not present

## 2023-01-08 DIAGNOSIS — H61812 Exostosis of left external canal: Secondary | ICD-10-CM

## 2023-01-08 DIAGNOSIS — K219 Gastro-esophageal reflux disease without esophagitis: Secondary | ICD-10-CM | POA: Diagnosis not present

## 2023-01-08 DIAGNOSIS — B449 Aspergillosis, unspecified: Secondary | ICD-10-CM | POA: Diagnosis not present

## 2023-01-08 DIAGNOSIS — F17210 Nicotine dependence, cigarettes, uncomplicated: Secondary | ICD-10-CM | POA: Diagnosis not present

## 2023-01-08 DIAGNOSIS — G4733 Obstructive sleep apnea (adult) (pediatric): Secondary | ICD-10-CM | POA: Diagnosis not present

## 2023-01-08 DIAGNOSIS — H60312 Diffuse otitis externa, left ear: Secondary | ICD-10-CM | POA: Insufficient documentation

## 2023-01-08 DIAGNOSIS — H6122 Impacted cerumen, left ear: Secondary | ICD-10-CM | POA: Diagnosis not present

## 2023-01-08 HISTORY — DX: Diffuse otitis externa, left ear: H60.312

## 2023-01-08 HISTORY — DX: Exostosis of left external canal: H61.812

## 2023-01-12 DIAGNOSIS — H6122 Impacted cerumen, left ear: Secondary | ICD-10-CM | POA: Diagnosis not present

## 2023-01-22 ENCOUNTER — Encounter: Payer: Self-pay | Admitting: Pharmacist

## 2023-01-22 NOTE — Progress Notes (Signed)
[facility])     [facility] Quality Pharmacy Team Statin Quality Measure Assessment  01/22/2023  Matthew Adams 01/21/1961 [id number]  Per review of chart and payor information, [name] has a diagnosis of cardiovascular disease but is not currently filling a statin prescription. This places patient into the [facility] (Statin Use In Patients with Cardiovascular Disease) measure for CMS.    [name] has not filled a statin prescription since 07/2022.  If clinically appropriate, please discuss statin compliance at tomorrow's office visit or  if he has experienced an intolerance with statin therapy, consider coding appropriately.  Please consider ONE of the following recommendations:  Initiate high intensity statin Atorvastatin 40mg  once daily, #90, 3 refills   Rosuvastatin 20mg  once daily, #90, 3 refills    Initiate moderate intensity  statin with reduced frequency if prior  statin intolerance 1x weekly, #13, 3 refills   2x weekly, #26, 3 refills   3x weekly, #39, 3 refills    Code for past statin intolerance  (required annually)  Provider Requirements: Must associate code during an office visit or telehealth encounter   Drug Induced Myopathy G72.0   Myalgia (SPC ONLY) M79.1   Myositis, unspecified M60.9   Myopathy, unspecified G72.9   Rhabdomyolysis M62.82   Thank you for allowing [facility] to be a part of this patient's care.  Dellie Burns, PharmD [facility]  [facility] Clinical Pharmacist Office: [phone]

## 2023-01-23 ENCOUNTER — Encounter: Payer: Self-pay | Admitting: Cardiology

## 2023-01-23 ENCOUNTER — Ambulatory Visit: Payer: Medicare Other | Attending: Cardiology | Admitting: Cardiology

## 2023-01-23 VITALS — BP 100/70 | HR 66 | Ht 69.0 in | Wt 188.0 lb

## 2023-01-23 DIAGNOSIS — Z951 Presence of aortocoronary bypass graft: Secondary | ICD-10-CM

## 2023-01-23 DIAGNOSIS — E7800 Pure hypercholesterolemia, unspecified: Secondary | ICD-10-CM

## 2023-01-23 DIAGNOSIS — I10 Essential (primary) hypertension: Secondary | ICD-10-CM

## 2023-01-23 DIAGNOSIS — E119 Type 2 diabetes mellitus without complications: Secondary | ICD-10-CM | POA: Diagnosis not present

## 2023-01-23 DIAGNOSIS — I2510 Atherosclerotic heart disease of native coronary artery without angina pectoris: Secondary | ICD-10-CM | POA: Diagnosis not present

## 2023-01-23 MED ORDER — ATORVASTATIN CALCIUM 80 MG PO TABS: 80.0000 mg | ORAL_TABLET | Freq: Every day | ORAL | 3 refills | Status: DC

## 2023-01-23 MED ORDER — RANOLAZINE ER 1000 MG PO TB12: 1000.0000 mg | ORAL_TABLET | Freq: Two times a day (BID) | ORAL | 3 refills | Status: DC

## 2023-01-23 NOTE — Patient Instructions (Signed)
Medication Instructions:  Your physician has recommended you make the following change in your medication:  Increase Lipitor to 80 mg daily  *If you need a refill on your cardiac medications before your next appointment, please call your pharmacy*   Lab Work: Your physician recommends that you return for lab work in: 6 weeks for a Fasting Lipid Panel, AST & ALT Lab opens at 8am. You DO NOT NEED an appointment. Best time to come is between 8am and 12noon and between 1:30 and 4:30. If you have been asked to fast for your blood work please have nothing to eat or drink after midnight. You may have water.   If you have labs (blood work) drawn today and your tests are completely normal, you will receive your results only by: MyChart Message (if you have MyChart) OR A paper copy in the mail If you have any lab test that is abnormal or we need to change your treatment, we will call you to review the results.   Testing/Procedures: NONE   Follow-Up: At [facility], you and your health needs are our priority.  As part of our continuing mission to provide you with exceptional heart care, we have created designated Provider Care Teams.  These Care Teams include your primary Cardiologist (physician) and Advanced Practice Providers (APPs -  Physician Assistants and Nurse Practitioners) who all work together to provide you with the care you need, when you need it.  We recommend signing up for the patient portal called "MyChart".  Sign up information is provided on this After Visit Summary.  MyChart is used to connect with patients for Virtual Visits (Telemedicine).  Patients are able to view lab/test results, encounter notes, upcoming appointments, etc.  Non-urgent messages can be sent to your provider as well.   To learn more about what you can do with MyChart, go to [email or web address].    Your next appointment:   6 month(s)  Provider:   Gypsy Balsam, MD    Other Instructions

## 2023-01-23 NOTE — Addendum Note (Signed)
Addended by: Roosvelt Harps R on: 01/23/2023 09:04 AM   Modules accepted: Orders

## 2023-01-23 NOTE — Progress Notes (Signed)
Cardiology Office Note:    Date:  01/23/2023   ID:  Matthew Adams, DOB September 12, 1961, MRN [id number]  PCP:  Paulina Fusi, MD  Cardiologist:  Gypsy Balsam, MD    Referring MD: Paulina Fusi, MD   Chief Complaint  Patient presents with   Follow-up    History of Present Illness:    Matthew Adams is a 62 y.o. male    who is being seen today for the evaluation of coronary artery disease at the request of Paulina Fusi, MD. past medical history significant for coronary artery disease, in 2016 he did have PTCA and stenting done, in 2014 he had coronary artery bypass graft x4 with sequential SVG to obtuse marginal 1, PDA, SVG to LAD, free LIMA to diagonal 1 as Y graft of SVG.  In September 2022 he had cardiac catheterization done which showed completely occluded free LIMA as well as SVG to LAD.  Additional problems include COPD, history of smoking, history of pulmonary emboli, he is on long-term anticoagulation, dyslipidemia, smoking which sadly still ongoing.  Comes today to my office for follow-up.  He looks like he may require left hip replacement surgery.  Overall seems to be doing fine.  Denies have any chest pain tightness squeezing pressure burning chest he is very active he can walk climb stairs does a lot of working in his yard and have no difficulty doing it.  Past Medical History:  Diagnosis Date   Abnormal ECG 08/09/2020   Abnormal nuclear stress test 12/25/2017   Angina pectoris 08/04/2022   Antiphospholipid antibody syndrome 08/04/2022   Formatting of this note might be different from the original. On chronic anticoagulation. Last PE 2011   Arthritis    hips   Atherosclerotic heart disease of native coronary artery without angina pectoris 05/18/2013   Formatting of this note might be different from the original. 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 COPD, FEV1 is 54% predicted and DLCO is 70% predicted.   COPD, severe 12/30/2011   Coronary artery disease    Diabetes mellitus    Dyslipidemia 12/04/2015   Dyspnea    Elevated PSA 01/25/2015   Erectile dysfunction 02/14/2019   GERD (gastroesophageal reflux disease)    Heart disease    Coronary stents placed 2006   Hematuria 04/24/2015   Hematuria, microscopic 01/25/2015   History of acromegaly 01/05/2016   History of appendectomy    History of pneumonia 04/12/2013   Formatting of this note might be different from the original. s/p ATB   History of pulmonary embolism 12/30/2011   Hx of CABG 06/04/2015   Formatting of this note might be different from the original. 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 03/24/13 T4 = 1.7 (0.82-1.77)   Hypothyroidism    Long term current use of anticoagulant therapy 06/17/2017   Myocardial infarction    pt denies   Old MI (myocardial  infarction) 07/05/2021   Pneumonia 07/2020   Preop cardiovascular exam 08/13/2020   Pulmonary cavitary lesion 12/30/2011   Pulmonary embolism    Secondary adrenal insufficiency 08/05/2017   Secondary hypothyroidism 01/05/2016   Sleep apnea    Type 2 diabetes mellitus without complication 06/04/2015    Past Surgical History:  Procedure Laterality Date   APPENDECTOMY     CARDIAC CATHETERIZATION  2019   1 stent   CORONARY ARTERY BYPASS GRAFT  2014   TOTAL HIP ARTHROPLASTY Right 09/19/2020   Procedure: TOTAL HIP ARTHROPLASTY ANTERIOR APPROACH;  Surgeon: Samson Frederic,  MD;  Location: [facility];  Service: Orthopedics;  Laterality: Right;   TRANSPHENOIDAL PITUITARY RESECTION  2003   recurrance treated with gamma knife    Current Medications: Current Meds  Medication Sig   albuterol (PROVENTIL) (2.5 MG/3ML) 0.083% nebulizer solution Take 2.5 mg by nebulization every 6 (six) hours as needed for wheezing or shortness of breath.   albuterol (VENTOLIN HFA) 108 (90 Base) MCG/ACT inhaler Inhale 1-2 puffs into the lungs every 6 (six) hours as needed for wheezing or shortness of breath.   atorvastatin (LIPITOR) 40 MG tablet Take 40 mg by mouth daily.   clopidogrel (PLAVIX) 75 MG tablet Take 1 tablet (75 mg total) by mouth daily.   docusate sodium (COLACE) 100 MG capsule Take 1 capsule (100 mg total) by mouth 2 (two) times daily.   Fluticasone-Salmeterol (ADVAIR) 500-50 MCG/DOSE AEPB Inhale 1 puff into the lungs 2 (two) times daily.   gabapentin (NEURONTIN) 300 MG capsule Take 300 mg by mouth at bedtime.   hydrocortisone (CORTEF) 10 MG tablet Take 5-10 mg by mouth See admin instructions. Take 10 mg by mouth in the morning and 5 mg in the evening   levothyroxine (SYNTHROID) 100 MCG tablet Take 100 mcg by mouth daily before breakfast.   metoprolol tartrate (LOPRESSOR) 25 MG tablet Take 12.5 mg by mouth daily.   nitroGLYCERIN (NITROSTAT) 0.4 MG SL tablet Place 1 tablet (0.4 mg total) under the tongue every 5 (five) minutes x 3 doses as needed for chest pain.   pantoprazole (PROTONIX) 40 MG tablet Take 40 mg by mouth at bedtime.   ranolazine (RANEXA) 1000 MG SR tablet Take 1 tablet (1,000 mg total) by mouth 2 (two) times daily.   senna (SENOKOT) 8.6 MG TABS tablet Take 2 tablets (17.2 mg total) by mouth at bedtime.   sildenafil (REVATIO) 20 MG tablet Take 20-100 mg by mouth daily as needed for erectile dysfunction.   simvastatin (ZOCOR) 40 MG tablet Take 40 mg by mouth daily.   SPIRIVA RESPIMAT 2.5 MCG/ACT AERS Inhale 1 puff into the lungs in the morning and at bedtime.    tamsulosin (FLOMAX) 0.4 MG CAPS capsule Take 0.4 mg by mouth at bedtime.    Testosterone 20.25 MG/ACT (1.62%) GEL Apply 3 Pump topically at bedtime. AFTER SHOWERING   XARELTO 15 MG TABS tablet Take 15 mg by mouth daily after supper.     Allergies:   Patient has no known allergies.   Social History   Socioeconomic History   Marital status: Divorced    Spouse name: Not on file   Number of children: Not on file   Years of education: Not on file   Highest education level: Not on file  Occupational History   Not on file  Tobacco Use   Smoking status: Every Day    Packs/day: 0.25    Years: 25.00    Additional pack years: 0.00  Total pack years: 6.25    Types: Cigarettes    Last attempt to quit: 11/10/2011    Years since quitting: 11.2   Smokeless tobacco: Never  Vaping Use   Vaping Use: Never used  Substance and Sexual Activity   Alcohol use: No   Drug use: No   Sexual activity: Not on file  Other Topics Concern   Not on file  Social History Narrative   Not on file   Social Determinants of Health   Financial Resource Strain: Not on file  Food Insecurity: Not on file  Transportation Needs: Not on file  Physical Activity: Not on file  Stress: Not on file  Social Connections: Not on file     Family History: The patient's family history includes Heart disease in his father. ROS:   Please see the history of present illness.    All 14 point review of systems negative except as described per history of present illness  EKGs/Labs/Other Studies Reviewed:      Recent Labs: No results found for requested labs within last 365 days.  Recent Lipid Panel No results found for: "CHOL", "TRIG", "HDL", "CHOLHDL", "VLDL", "LDLCALC", "LDLDIRECT"  Physical Exam:    VS:  BP 100/70 (BP Location: Left Arm, Patient Position: Sitting, Cuff Size: Normal)   Pulse 66   Ht  (1.753 m)   Wt 188 lb (85.3 kg)   SpO2 93%   BMI 27.76 kg/m     Wt Readings from Last 3 Encounters:   01/23/23 188 lb (85.3 kg)  10/09/22 187 lb (84.8 kg)  08/07/22 189 lb (85.7 kg)     GEN:  Well nourished, well developed in no acute distress HEENT: Normal NECK: No JVD; No carotid bruits LYMPHATICS: No lymphadenopathy CARDIAC: RRR, no murmurs, no rubs, no gallops RESPIRATORY:  Clear to auscultation without rales, wheezing or rhonchi  ABDOMEN: Soft, non-tender, non-distended MUSCULOSKELETAL:  No edema; No deformity  SKIN: Warm and dry LOWER EXTREMITIES: no swelling NEUROLOGIC:  Alert and oriented x 3 PSYCHIATRIC:  Normal affect   ASSESSMENT:    1. Atherosclerosis of native coronary artery of native heart without angina pectoris   2. Primary hypertension   3. Type 2 diabetes mellitus without complication, without long-term current use of insulin   4. Hx of CABG    PLAN:    In order of problems listed above:  Coronary disease seems to be stable from that point review I did review stress test done in October.  Stable and appropriate guideline directed medical therapy. Dyslipidemia I did review his K PN which show me his LDL of 114 HDL 28.  Will double the dose of Lipitor to 80 mg daily.  Fasting lipid profile is TLT will be done in 6 weeks. Cardiomyopathy.  Will have difficulty putting him on guideline directed medical therapy because of low blood pressure.  He is only on metoprolol only 12.5 mg daily.  And his blood pressure today is only 100/70.  Therefore we will continue present management, likely he is compensated on the physical exam. Cardiovascular preop evaluation for potential hip replacement.  Stress test done in October reviewed, will most likely require an echocardiogram before his surgery.   Medication Adjustments/Labs and Tests Ordered: Current medicines are reviewed at length with the patient today.  Concerns regarding medicines are outlined above.  No orders of the defined types were placed in this encounter.  Medication changes: No orders of the defined types  were placed in this encounter.  Signed, Georgeanna Lea, MD, [facility] 01/23/2023 8:50 AM    [facility]

## 2023-02-02 DIAGNOSIS — H60312 Diffuse otitis externa, left ear: Secondary | ICD-10-CM | POA: Diagnosis not present

## 2023-02-02 DIAGNOSIS — E785 Hyperlipidemia, unspecified: Secondary | ICD-10-CM | POA: Diagnosis not present

## 2023-02-02 DIAGNOSIS — J441 Chronic obstructive pulmonary disease with (acute) exacerbation: Secondary | ICD-10-CM | POA: Diagnosis not present

## 2023-02-09 DIAGNOSIS — E785 Hyperlipidemia, unspecified: Secondary | ICD-10-CM | POA: Diagnosis not present

## 2023-02-09 DIAGNOSIS — D509 Iron deficiency anemia, unspecified: Secondary | ICD-10-CM | POA: Diagnosis not present

## 2023-02-09 DIAGNOSIS — I119 Hypertensive heart disease without heart failure: Secondary | ICD-10-CM | POA: Diagnosis not present

## 2023-02-09 DIAGNOSIS — R739 Hyperglycemia, unspecified: Secondary | ICD-10-CM | POA: Diagnosis not present

## 2023-02-09 DIAGNOSIS — E559 Vitamin D deficiency, unspecified: Secondary | ICD-10-CM | POA: Diagnosis not present

## 2023-02-26 DIAGNOSIS — E2749 Other adrenocortical insufficiency: Secondary | ICD-10-CM | POA: Diagnosis not present

## 2023-02-26 DIAGNOSIS — E893 Postprocedural hypopituitarism: Secondary | ICD-10-CM | POA: Diagnosis not present

## 2023-02-26 DIAGNOSIS — E038 Other specified hypothyroidism: Secondary | ICD-10-CM | POA: Diagnosis not present

## 2023-04-02 DIAGNOSIS — K219 Gastro-esophageal reflux disease without esophagitis: Secondary | ICD-10-CM | POA: Diagnosis not present

## 2023-04-02 DIAGNOSIS — B449 Aspergillosis, unspecified: Secondary | ICD-10-CM | POA: Diagnosis not present

## 2023-04-02 DIAGNOSIS — F17210 Nicotine dependence, cigarettes, uncomplicated: Secondary | ICD-10-CM | POA: Diagnosis not present

## 2023-04-02 DIAGNOSIS — R918 Other nonspecific abnormal finding of lung field: Secondary | ICD-10-CM | POA: Diagnosis not present

## 2023-04-02 DIAGNOSIS — G4733 Obstructive sleep apnea (adult) (pediatric): Secondary | ICD-10-CM | POA: Diagnosis not present

## 2023-04-02 DIAGNOSIS — J441 Chronic obstructive pulmonary disease with (acute) exacerbation: Secondary | ICD-10-CM | POA: Diagnosis not present

## 2023-04-07 DIAGNOSIS — M19011 Primary osteoarthritis, right shoulder: Secondary | ICD-10-CM | POA: Diagnosis not present

## 2023-04-07 DIAGNOSIS — M19012 Primary osteoarthritis, left shoulder: Secondary | ICD-10-CM | POA: Diagnosis not present

## 2023-04-14 DIAGNOSIS — H60313 Diffuse otitis externa, bilateral: Secondary | ICD-10-CM | POA: Diagnosis not present

## 2023-04-14 DIAGNOSIS — H6123 Impacted cerumen, bilateral: Secondary | ICD-10-CM | POA: Diagnosis not present

## 2023-04-14 DIAGNOSIS — I259 Chronic ischemic heart disease, unspecified: Secondary | ICD-10-CM | POA: Diagnosis not present

## 2023-05-11 DIAGNOSIS — R911 Solitary pulmonary nodule: Secondary | ICD-10-CM | POA: Diagnosis not present

## 2023-05-11 DIAGNOSIS — R590 Localized enlarged lymph nodes: Secondary | ICD-10-CM | POA: Diagnosis not present

## 2023-05-11 DIAGNOSIS — R918 Other nonspecific abnormal finding of lung field: Secondary | ICD-10-CM | POA: Diagnosis not present

## 2023-05-20 DIAGNOSIS — R918 Other nonspecific abnormal finding of lung field: Secondary | ICD-10-CM | POA: Diagnosis not present

## 2023-05-20 DIAGNOSIS — J441 Chronic obstructive pulmonary disease with (acute) exacerbation: Secondary | ICD-10-CM | POA: Diagnosis not present

## 2023-05-20 DIAGNOSIS — G4733 Obstructive sleep apnea (adult) (pediatric): Secondary | ICD-10-CM | POA: Diagnosis not present

## 2023-05-20 DIAGNOSIS — K219 Gastro-esophageal reflux disease without esophagitis: Secondary | ICD-10-CM | POA: Diagnosis not present

## 2023-05-20 DIAGNOSIS — B449 Aspergillosis, unspecified: Secondary | ICD-10-CM | POA: Diagnosis not present

## 2023-05-20 DIAGNOSIS — F17210 Nicotine dependence, cigarettes, uncomplicated: Secondary | ICD-10-CM | POA: Diagnosis not present

## 2023-06-04 DIAGNOSIS — G4733 Obstructive sleep apnea (adult) (pediatric): Secondary | ICD-10-CM | POA: Diagnosis not present

## 2023-06-04 DIAGNOSIS — K219 Gastro-esophageal reflux disease without esophagitis: Secondary | ICD-10-CM | POA: Diagnosis not present

## 2023-06-04 DIAGNOSIS — F17210 Nicotine dependence, cigarettes, uncomplicated: Secondary | ICD-10-CM | POA: Diagnosis not present

## 2023-06-04 DIAGNOSIS — R918 Other nonspecific abnormal finding of lung field: Secondary | ICD-10-CM | POA: Diagnosis not present

## 2023-06-04 DIAGNOSIS — J449 Chronic obstructive pulmonary disease, unspecified: Secondary | ICD-10-CM | POA: Diagnosis not present

## 2023-06-09 DIAGNOSIS — R7303 Prediabetes: Secondary | ICD-10-CM | POA: Diagnosis not present

## 2023-06-09 DIAGNOSIS — E038 Other specified hypothyroidism: Secondary | ICD-10-CM | POA: Diagnosis not present

## 2023-06-09 DIAGNOSIS — I2510 Atherosclerotic heart disease of native coronary artery without angina pectoris: Secondary | ICD-10-CM | POA: Diagnosis not present

## 2023-06-09 DIAGNOSIS — E782 Mixed hyperlipidemia: Secondary | ICD-10-CM | POA: Diagnosis not present

## 2023-06-09 DIAGNOSIS — I10 Essential (primary) hypertension: Secondary | ICD-10-CM | POA: Diagnosis not present

## 2023-06-09 DIAGNOSIS — I700 Atherosclerosis of aorta: Secondary | ICD-10-CM | POA: Diagnosis not present

## 2023-06-09 DIAGNOSIS — I252 Old myocardial infarction: Secondary | ICD-10-CM | POA: Diagnosis not present

## 2023-06-09 DIAGNOSIS — Z951 Presence of aortocoronary bypass graft: Secondary | ICD-10-CM | POA: Diagnosis not present

## 2023-06-09 DIAGNOSIS — G629 Polyneuropathy, unspecified: Secondary | ICD-10-CM | POA: Diagnosis not present

## 2023-06-11 DIAGNOSIS — Z9181 History of falling: Secondary | ICD-10-CM | POA: Diagnosis not present

## 2023-06-11 DIAGNOSIS — Z0000 Encounter for general adult medical examination without abnormal findings: Secondary | ICD-10-CM | POA: Diagnosis not present

## 2023-06-17 DIAGNOSIS — H6123 Impacted cerumen, bilateral: Secondary | ICD-10-CM | POA: Diagnosis not present

## 2023-06-17 DIAGNOSIS — H60313 Diffuse otitis externa, bilateral: Secondary | ICD-10-CM | POA: Diagnosis not present

## 2023-07-01 ENCOUNTER — Telehealth: Payer: Self-pay

## 2023-07-01 NOTE — Telephone Encounter (Signed)
Pt came by stating that he had been on Lisinopril in the past by another physician and wanted to now if it should be refilled. This medications is not on his med list and was not mentioned by Dr. Bing Matter at his April 2024 appt. He will follow up in October. Appt made.

## 2023-07-10 ENCOUNTER — Other Ambulatory Visit: Payer: Self-pay

## 2023-07-10 MED ORDER — CLOPIDOGREL BISULFATE 75 MG PO TABS: 75.0000 mg | ORAL_TABLET | Freq: Every day | ORAL | 3 refills | Status: DC

## 2023-07-22 DIAGNOSIS — H6693 Otitis media, unspecified, bilateral: Secondary | ICD-10-CM | POA: Diagnosis not present

## 2023-07-27 ENCOUNTER — Other Ambulatory Visit: Payer: Self-pay

## 2023-07-27 DIAGNOSIS — D496 Neoplasm of unspecified behavior of brain: Secondary | ICD-10-CM | POA: Insufficient documentation

## 2023-07-27 DIAGNOSIS — I219 Acute myocardial infarction, unspecified: Secondary | ICD-10-CM | POA: Insufficient documentation

## 2023-07-27 DIAGNOSIS — K219 Gastro-esophageal reflux disease without esophagitis: Secondary | ICD-10-CM | POA: Insufficient documentation

## 2023-07-27 DIAGNOSIS — M1990 Unspecified osteoarthritis, unspecified site: Secondary | ICD-10-CM | POA: Insufficient documentation

## 2023-07-27 DIAGNOSIS — E039 Hypothyroidism, unspecified: Secondary | ICD-10-CM | POA: Insufficient documentation

## 2023-07-27 DIAGNOSIS — I2699 Other pulmonary embolism without acute cor pulmonale: Secondary | ICD-10-CM | POA: Insufficient documentation

## 2023-07-27 DIAGNOSIS — Z9049 Acquired absence of other specified parts of digestive tract: Secondary | ICD-10-CM | POA: Insufficient documentation

## 2023-07-27 DIAGNOSIS — R0600 Dyspnea, unspecified: Secondary | ICD-10-CM | POA: Insufficient documentation

## 2023-07-27 DIAGNOSIS — I2510 Atherosclerotic heart disease of native coronary artery without angina pectoris: Secondary | ICD-10-CM | POA: Insufficient documentation

## 2023-07-30 ENCOUNTER — Ambulatory Visit: Payer: Medicare Other | Attending: Cardiology | Admitting: Cardiology

## 2023-07-30 ENCOUNTER — Encounter: Payer: Self-pay | Admitting: Cardiology

## 2023-07-30 VITALS — BP 124/84 | HR 88 | Ht 69.0 in | Wt 187.2 lb

## 2023-07-30 DIAGNOSIS — I10 Essential (primary) hypertension: Secondary | ICD-10-CM

## 2023-07-30 DIAGNOSIS — I2510 Atherosclerotic heart disease of native coronary artery without angina pectoris: Secondary | ICD-10-CM | POA: Diagnosis not present

## 2023-07-30 DIAGNOSIS — R0609 Other forms of dyspnea: Secondary | ICD-10-CM

## 2023-07-30 DIAGNOSIS — Z951 Presence of aortocoronary bypass graft: Secondary | ICD-10-CM

## 2023-07-30 DIAGNOSIS — E7800 Pure hypercholesterolemia, unspecified: Secondary | ICD-10-CM

## 2023-07-30 MED ORDER — CLOPIDOGREL BISULFATE 75 MG PO TABS: 75.0000 mg | ORAL_TABLET | Freq: Every day | ORAL | 3 refills | Status: DC

## 2023-07-30 MED ORDER — NITROGLYCERIN 0.4 MG SL SUBL: 0.4000 mg | SUBLINGUAL_TABLET | SUBLINGUAL | 11 refills | Status: AC | PRN

## 2023-07-30 MED ORDER — ATORVASTATIN CALCIUM 80 MG PO TABS: 80.0000 mg | ORAL_TABLET | Freq: Every day | ORAL | 3 refills | Status: DC

## 2023-07-30 MED ORDER — RANOLAZINE ER 1000 MG PO TB12: 1000.0000 mg | ORAL_TABLET | Freq: Two times a day (BID) | ORAL | 3 refills | Status: DC

## 2023-07-30 NOTE — Patient Instructions (Signed)
Medication Instructions:  Your physician recommends that you continue on your current medications as directed. Please refer to the Current Medication list given to you today.  *If you need a refill on your cardiac medications before your next appointment, please call your pharmacy*   Lab Work: Lipid, AST, ALT- today  Testing/Procedures: Your physician has requested that you have an echocardiogram. Echocardiography is a painless test that uses sound waves to create images of your heart. It provides your doctor with information about the size and shape of your heart and how well your heart's chambers and valves are working. This procedure takes approximately one hour. There are no restrictions for this procedure. Please do NOT wear cologne, perfume, aftershave, or lotions (deodorant is allowed). Please arrive 15 minutes prior to your appointment time.    Follow-Up: At [facility], you and your health needs are our priority.  As part of our continuing mission to provide you with exceptional heart care, we have created designated Provider Care Teams.  These Care Teams include your primary Cardiologist (physician) and Advanced Practice Providers (APPs -  Physician Assistants and Nurse Practitioners) who all work together to provide you with the care you need, when you need it.  We recommend signing up for the patient portal called "MyChart".  Sign up information is provided on this After Visit Summary.  MyChart is used to connect with patients for Virtual Visits (Telemedicine).  Patients are able to view lab/test results, encounter notes, upcoming appointments, etc.  Non-urgent messages can be sent to your provider as well.   To learn more about what you can do with MyChart, go to [email or web address].    Your next appointment:   6 month(s)  The format for your next appointment:   In Person  Provider:   Gypsy Balsam, MD    Other Instructions NA

## 2023-07-30 NOTE — Progress Notes (Signed)
Cardiology Office Note:    Date:  07/30/2023   ID:  Matthew Adams, DOB 05/28/1961, MRN [id number]  PCP:  Gus Height, PA  Cardiologist:  Gypsy Balsam, MD    Referring MD: Paulina Fusi, MD   No chief complaint on file.   History of Present Illness:    Matthew Adams is a 62 y.o. male   who is being seen today for the evaluation of coronary artery disease at the request of Paulina Fusi, MD. past medical history significant for coronary artery disease, in 2016 he did have PTCA and stenting done, in 2014 he had coronary artery bypass graft x4 with sequential SVG to obtuse marginal 1, PDA, SVG to LAD, free LIMA to diagonal 1 as Y graft of SVG.  In September 2022 he had cardiac catheterization done which showed completely occluded free LIMA as well as SVG to LAD.  Additional problems include COPD, history of smoking, history of pulmonary emboli, he is on long-term anticoagulation, dyslipidemia, smoking which sadly still ongoing.  Comes today to my office for follow-up.  Overall doing well he was scheduled to have hip surgery however he became sick and surgery never happened denies have any chest pain tightness squeezing pressure burning chest.  Sadly he still continues to smoke  Past Medical History:  Diagnosis Date   Abnormal ECG 08/09/2020   Abnormal nuclear stress test 12/25/2017   Acute diffuse otitis externa of left ear 01/08/2023   Angina pectoris (HCC) 08/04/2022   Antiphospholipid antibody syndrome (HCC) 08/04/2022   Formatting of this note might be different from the original. On chronic anticoagulation. Last PE 2011   Arthritis    hips   Atherosclerotic heart disease of native coronary artery without angina pectoris 05/18/2013   Formatting of this note might be different from the original. 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 COPD, FEV1 is 54% predicted and DLCO is 70% predicted.   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 s/p ATB   History of pulmonary embolism 12/30/2011   Hx of CABG 06/04/2015   Formatting of this note might be different from the original. 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/05/2021  Pneumonia 07/2020   Preop cardiovascular exam 08/13/2020   Pulmonary cavitary lesion 12/30/2011   Pulmonary embolism (HCC)    Secondary adrenal insufficiency (HCC) 08/05/2017   Secondary hypothyroidism 01/05/2016   Severe chronic obstructive pulmonary disease (HCC) 12/30/2011   IMO SNOMED Dx Update Oct 2024     Sleep apnea    Smoking 08/05/2022   Type 2 diabetes mellitus without complication (HCC) 06/04/2015    Past Surgical History:  Procedure Laterality Date   APPENDECTOMY     CARDIAC CATHETERIZATION  2019   1 stent    CORONARY ARTERY BYPASS GRAFT  2014   TOTAL HIP ARTHROPLASTY Right 09/19/2020   Procedure: TOTAL HIP ARTHROPLASTY ANTERIOR APPROACH;  Surgeon: Samson Frederic, MD;  Location: [facility];  Service: Orthopedics;  Laterality: Right;   TRANSPHENOIDAL PITUITARY RESECTION  2003   recurrance treated with gamma knife    Current Medications: Current Meds  Medication Sig   albuterol (PROVENTIL) (2.5 MG/3ML) 0.083% nebulizer solution Take 2.5 mg by nebulization every 6 (six) hours as needed for wheezing or shortness of breath.   albuterol (VENTOLIN HFA) 108 (90 Base) MCG/ACT inhaler Inhale 1-2 puffs into the lungs every 6 (six) hours as needed for wheezing or shortness of breath.   atorvastatin (LIPITOR) 80 MG tablet Take 1 tablet (80 mg total) by mouth daily.   clopidogrel (PLAVIX) 75 MG tablet Take 1 tablet (75 mg total) by mouth daily.   docusate sodium (COLACE) 100 MG capsule Take 1 capsule (100 mg total) by mouth 2 (two) times daily.   fenofibrate (TRICOR) 145 MG tablet Take 145 mg by mouth daily.   Fluticasone-Salmeterol (ADVAIR) 500-50 MCG/DOSE AEPB Inhale 1 puff into the lungs 2 (two) times daily.   gabapentin (NEURONTIN) 300 MG capsule Take 300 mg by mouth at bedtime.   hydrocortisone (CORTEF) 10 MG tablet Take 5-10 mg by mouth See admin instructions. Take 10 mg by mouth in the morning and 5 mg in the evening   levothyroxine (SYNTHROID) 100 MCG tablet Take 100 mcg by mouth daily before breakfast.   metoprolol tartrate (LOPRESSOR) 25 MG tablet Take 12.5 mg by mouth daily.   nitroGLYCERIN (NITROSTAT) 0.4 MG SL tablet Place 1 tablet (0.4 mg total) under the tongue every 5 (five) minutes x 3 doses as needed for chest pain.   pantoprazole (PROTONIX) 40 MG tablet Take 40 mg by mouth at bedtime.   ranolazine (RANEXA) 1000 MG SR tablet Take 1 tablet (1,000 mg total) by mouth 2 (two) times daily.   senna (SENOKOT) 8.6 MG TABS tablet Take 2 tablets (17.2 mg total) by mouth at bedtime.   sildenafil (REVATIO)  20 MG tablet Take 20-100 mg by mouth daily as needed for erectile dysfunction.   simvastatin (ZOCOR) 40 MG tablet Take 40 mg by mouth daily.   SPIRIVA RESPIMAT 2.5 MCG/ACT AERS Inhale 1 puff into the lungs in the morning and at bedtime.   tamsulosin (FLOMAX) 0.4 MG CAPS capsule Take 0.4 mg by mouth at bedtime.    Testosterone 20.25 MG/ACT (1.62%) GEL Apply 3 Pump topically at bedtime. AFTER SHOWERING   XARELTO 15 MG TABS tablet Take 15 mg by mouth daily after supper.     Allergies:   Patient has no known allergies.   Social History   Socioeconomic History   Marital status: Divorced    Spouse name: Not on file   Number of children: Not on file   Years of education: Not on file   Highest education level: Not on  file  Occupational History   Not on file  Tobacco Use   Smoking status: Every Day    Current packs/day: 0.00    Average packs/day: 0.3 packs/day for 25.0 years (6.3 ttl pk-yrs)    Types: Cigarettes    Start date: 11/09/1986    Last attempt to quit: 11/10/2011    Years since quitting: 11.7   Smokeless tobacco: Never  Vaping Use   Vaping status: Never Used  Substance and Sexual Activity   Alcohol use: No   Drug use: No   Sexual activity: Not on file  Other Topics Concern   Not on file  Social History Narrative   Not on file   Social Determinants of Health   Financial Resource Strain: Not on file  Food Insecurity: Not on file  Transportation Needs: Not on file  Physical Activity: Not on file  Stress: Not on file  Social Connections: Not on file     Family History: The patient's family history includes Heart disease in his father. ROS:   Please see the history of present illness.    All 14 point review of systems negative except as described per history of present illness  EKGs/Labs/Other Studies Reviewed:         Recent Labs: No results found for requested labs within last 365 days.  Recent Lipid Panel No results found for: "CHOL", "TRIG", "HDL",  "CHOLHDL", "VLDL", "LDLCALC", "LDLDIRECT"  Physical Exam:    VS:  BP 124/84   Pulse 88   Ht 5\' 9"  (1.753 m)   Wt 187 lb 3.2 oz (84.9 kg)   SpO2 94%   BMI 27.64 kg/m     Wt Readings from Last 3 Encounters:  07/30/23 187 lb 3.2 oz (84.9 kg)  01/23/23 188 lb (85.3 kg)  10/09/22 187 lb (84.8 kg)     GEN:  Well nourished, well developed in no acute distress HEENT: Normal NECK: No JVD; No carotid bruits LYMPHATICS: No lymphadenopathy CARDIAC: RRR, no murmurs, no rubs, no gallops RESPIRATORY:  Clear to auscultation without rales, wheezing or rhonchi  ABDOMEN: Soft, non-tender, non-distended MUSCULOSKELETAL:  No edema; No deformity  SKIN: Warm and dry LOWER EXTREMITIES: no swelling NEUROLOGIC:  Alert and oriented x 3 PSYCHIATRIC:  Normal affect   ASSESSMENT:    1. Atherosclerosis of native coronary artery of native heart without angina pectoris   2. Primary hypertension   3. Hx of CABG   4. Hypercholesterolemia    PLAN:    In order of problems listed above:  Coronary artery disease doing well from that point review no symptoms that would indicate reactivation of the problem stress test done last year negative.  Will continue present management which include antiplatelets therapy. Dyslipidemia I did review K PN done by primary care physician labs show LDL 66 HDL 35, triglycerides were 249 he was started with fenofibrate which is fine.  I will check his fasting lipid profile today. History of pulmonary emboli anticoagulated which I will continue. Smoking still ongoing we spent at least 5 minutes talking about this I strongly recommend to quit to give him something to help to do that.  He is short of breath which probably related to COPD that he does have.  I will check echocardiogram to make sure his ejection fraction is preserved   Medication Adjustments/Labs and Tests Ordered: Current medicines are reviewed at length with the patient today.  Concerns regarding medicines are  outlined above.  No orders of the defined types were  placed in this encounter.  Medication changes: No orders of the defined types were placed in this encounter.   Signed, Georgeanna Lea, MD, [facility] 07/30/2023 8:07 AM    [facility]

## 2023-07-31 LAB — LIPID PANEL
Chol/HDL Ratio: 4.4 {ratio} (ref 0.0–5.0)
Cholesterol, Total: 169 mg/dL (ref 100–199)
HDL: 38 mg/dL — ABNORMAL LOW (ref >39)
LDL Chol Calc (NIH): 108 mg/dL — ABNORMAL HIGH (ref 0–99)
Triglycerides: 126 mg/dL (ref 0–149)
VLDL Cholesterol Cal: 23 mg/dL (ref 5–40)

## 2023-07-31 LAB — AST: AST: 10 [IU]/L (ref 0–40)

## 2023-07-31 LAB — ALT: ALT: 12 [IU]/L (ref 0–44)

## 2023-08-03 DIAGNOSIS — K219 Gastro-esophageal reflux disease without esophagitis: Secondary | ICD-10-CM | POA: Diagnosis not present

## 2023-08-03 DIAGNOSIS — K5790 Diverticulosis of intestine, part unspecified, without perforation or abscess without bleeding: Secondary | ICD-10-CM | POA: Diagnosis not present

## 2023-08-03 DIAGNOSIS — K648 Other hemorrhoids: Secondary | ICD-10-CM | POA: Diagnosis not present

## 2023-08-03 DIAGNOSIS — K2270 Barrett's esophagus without dysplasia: Secondary | ICD-10-CM | POA: Diagnosis not present

## 2023-08-06 ENCOUNTER — Telehealth: Payer: Self-pay

## 2023-08-06 DIAGNOSIS — E7800 Pure hypercholesterolemia, unspecified: Secondary | ICD-10-CM

## 2023-08-06 MED ORDER — EZETIMIBE 10 MG PO TABS: 10.0000 mg | ORAL_TABLET | Freq: Every day | ORAL | 3 refills | Status: DC

## 2023-08-06 NOTE — Telephone Encounter (Signed)
Cholesterol Results reviewed with pt as per Dr. Vanetta Shawl note.  Pt verbalized understanding and had no additional questions. Zetia 10mg  sent to [facility] per pt request.  Routed to PCP

## 2023-08-21 DIAGNOSIS — J208 Acute bronchitis due to other specified organisms: Secondary | ICD-10-CM | POA: Diagnosis not present

## 2023-08-28 ENCOUNTER — Ambulatory Visit: Payer: Medicare Other | Attending: Cardiology

## 2023-08-28 DIAGNOSIS — R0609 Other forms of dyspnea: Secondary | ICD-10-CM | POA: Diagnosis not present

## 2023-08-28 LAB — ECHOCARDIOGRAM COMPLETE
Area-P 1/2: 3.53 cm2
Calc EF: 43.5 %
S' Lateral: 3.2 cm
Single Plane A2C EF: 43.8 %
Single Plane A4C EF: 43.1 %

## 2023-08-31 DIAGNOSIS — E2749 Other adrenocortical insufficiency: Secondary | ICD-10-CM | POA: Diagnosis not present

## 2023-08-31 DIAGNOSIS — E038 Other specified hypothyroidism: Secondary | ICD-10-CM | POA: Diagnosis not present

## 2023-09-17 DIAGNOSIS — J449 Chronic obstructive pulmonary disease, unspecified: Secondary | ICD-10-CM | POA: Diagnosis not present

## 2023-09-17 DIAGNOSIS — R918 Other nonspecific abnormal finding of lung field: Secondary | ICD-10-CM | POA: Diagnosis not present

## 2023-09-17 DIAGNOSIS — G4733 Obstructive sleep apnea (adult) (pediatric): Secondary | ICD-10-CM | POA: Diagnosis not present

## 2023-09-17 DIAGNOSIS — F17210 Nicotine dependence, cigarettes, uncomplicated: Secondary | ICD-10-CM | POA: Diagnosis not present

## 2023-09-17 DIAGNOSIS — R911 Solitary pulmonary nodule: Secondary | ICD-10-CM | POA: Diagnosis not present

## 2023-09-21 DIAGNOSIS — E7800 Pure hypercholesterolemia, unspecified: Secondary | ICD-10-CM | POA: Diagnosis not present

## 2023-09-22 LAB — AST: AST: 14 [IU]/L (ref 0–40)

## 2023-09-22 LAB — LIPID PANEL
Chol/HDL Ratio: 3.3 {ratio} (ref 0.0–5.0)
Cholesterol, Total: 154 mg/dL (ref 100–199)
HDL: 46 mg/dL (ref >39)
LDL Chol Calc (NIH): 87 mg/dL (ref 0–99)
Triglycerides: 116 mg/dL (ref 0–149)
VLDL Cholesterol Cal: 21 mg/dL (ref 5–40)

## 2023-09-22 LAB — ALT: ALT: 14 [IU]/L (ref 0–44)

## 2023-09-24 ENCOUNTER — Telehealth: Payer: Self-pay

## 2023-09-24 NOTE — Telephone Encounter (Signed)
Patient notified of results and verbalized understanding.  

## 2023-09-24 NOTE — Telephone Encounter (Signed)
-----   Message from Gypsy Balsam sent at 09/03/2023  9:18 AM EST ----- Echocardiogram showed mildly reduced left ventricle ejection fraction 4045%, as before.  Continue medical management

## 2023-11-10 DIAGNOSIS — Z23 Encounter for immunization: Secondary | ICD-10-CM | POA: Diagnosis not present

## 2023-11-18 ENCOUNTER — Telehealth: Payer: Self-pay

## 2023-11-18 DIAGNOSIS — I2510 Atherosclerotic heart disease of native coronary artery without angina pectoris: Secondary | ICD-10-CM

## 2023-11-18 DIAGNOSIS — R911 Solitary pulmonary nodule: Secondary | ICD-10-CM | POA: Diagnosis not present

## 2023-11-18 DIAGNOSIS — E7800 Pure hypercholesterolemia, unspecified: Secondary | ICD-10-CM

## 2023-11-18 MED ORDER — NEXLIZET 180-10 MG PO TABS: 1.0000 | ORAL_TABLET | Freq: Every day | ORAL | 1 refills | Status: DC

## 2023-11-18 NOTE — Telephone Encounter (Signed)
-----   Message from Ralene Burger sent at 09/29/2023  8:55 PM EST ----- Cholesterol still not where it supposed to be.  Please stop Zetia  and add Nexlizet  that, as a lipid profile, AST LT 6 weeks

## 2023-11-18 NOTE — Telephone Encounter (Signed)
 Patient notified of results and recommendations and agreed with plan, medication sent, lab order on file.

## 2023-11-19 ENCOUNTER — Telehealth: Payer: Self-pay

## 2023-11-19 ENCOUNTER — Telehealth: Payer: Self-pay | Admitting: Pharmacy Technician

## 2023-11-19 ENCOUNTER — Other Ambulatory Visit (HOSPITAL_COMMUNITY): Payer: Self-pay

## 2023-11-19 NOTE — Telephone Encounter (Signed)
 Pharmacy Patient Advocate Encounter   Received notification from Pt Calls Messages that prior authorization for Nexlizet  180-10MG  tablets is required/requested.   Insurance verification completed.   The patient is insured through [employer] .   Per test claim: PA required; PA submitted to above mentioned insurance via CoverMyMeds Key/confirmation #/EOC [id number] Status is pending

## 2023-11-20 NOTE — Telephone Encounter (Signed)
 LM to return my call.

## 2023-11-20 NOTE — Telephone Encounter (Signed)
 Pharmacy Patient Advocate Encounter  Received notification from [facility] that Prior Authorization for Nexlizet  180-10MG  tablets has been APPROVED from 11/19/23 to 05/18/24. Spoke to pharmacy to process.Copay is $12.15.    PA #/Case ID/Reference #: [id number]

## 2023-11-23 NOTE — Telephone Encounter (Signed)
 Patient notified of the following authorization

## 2023-11-23 NOTE — Telephone Encounter (Signed)
 Matthew Adams

## 2023-12-10 DIAGNOSIS — J439 Emphysema, unspecified: Secondary | ICD-10-CM | POA: Diagnosis not present

## 2023-12-10 DIAGNOSIS — Z951 Presence of aortocoronary bypass graft: Secondary | ICD-10-CM | POA: Diagnosis not present

## 2023-12-10 DIAGNOSIS — E785 Hyperlipidemia, unspecified: Secondary | ICD-10-CM | POA: Diagnosis not present

## 2023-12-10 DIAGNOSIS — E782 Mixed hyperlipidemia: Secondary | ICD-10-CM | POA: Diagnosis not present

## 2023-12-10 DIAGNOSIS — Z86018 Personal history of other benign neoplasm: Secondary | ICD-10-CM | POA: Diagnosis not present

## 2023-12-10 DIAGNOSIS — I700 Atherosclerosis of aorta: Secondary | ICD-10-CM | POA: Diagnosis not present

## 2023-12-10 DIAGNOSIS — E038 Other specified hypothyroidism: Secondary | ICD-10-CM | POA: Diagnosis not present

## 2023-12-10 DIAGNOSIS — D649 Anemia, unspecified: Secondary | ICD-10-CM | POA: Diagnosis not present

## 2023-12-10 DIAGNOSIS — J0100 Acute maxillary sinusitis, unspecified: Secondary | ICD-10-CM | POA: Diagnosis not present

## 2023-12-10 DIAGNOSIS — I252 Old myocardial infarction: Secondary | ICD-10-CM | POA: Diagnosis not present

## 2023-12-10 DIAGNOSIS — R7303 Prediabetes: Secondary | ICD-10-CM | POA: Diagnosis not present

## 2023-12-10 DIAGNOSIS — I2510 Atherosclerotic heart disease of native coronary artery without angina pectoris: Secondary | ICD-10-CM | POA: Diagnosis not present

## 2023-12-10 DIAGNOSIS — I10 Essential (primary) hypertension: Secondary | ICD-10-CM | POA: Diagnosis not present

## 2023-12-31 DIAGNOSIS — J449 Chronic obstructive pulmonary disease, unspecified: Secondary | ICD-10-CM | POA: Diagnosis not present

## 2023-12-31 DIAGNOSIS — F17210 Nicotine dependence, cigarettes, uncomplicated: Secondary | ICD-10-CM | POA: Diagnosis not present

## 2023-12-31 DIAGNOSIS — J479 Bronchiectasis, uncomplicated: Secondary | ICD-10-CM | POA: Diagnosis not present

## 2023-12-31 DIAGNOSIS — R918 Other nonspecific abnormal finding of lung field: Secondary | ICD-10-CM | POA: Diagnosis not present

## 2023-12-31 DIAGNOSIS — G4733 Obstructive sleep apnea (adult) (pediatric): Secondary | ICD-10-CM | POA: Diagnosis not present

## 2024-01-12 DIAGNOSIS — D649 Anemia, unspecified: Secondary | ICD-10-CM | POA: Diagnosis not present

## 2024-01-20 ENCOUNTER — Other Ambulatory Visit: Payer: Self-pay | Admitting: Orthopedic Surgery

## 2024-01-20 DIAGNOSIS — M19012 Primary osteoarthritis, left shoulder: Secondary | ICD-10-CM | POA: Diagnosis not present

## 2024-01-20 DIAGNOSIS — M19011 Primary osteoarthritis, right shoulder: Secondary | ICD-10-CM

## 2024-01-25 DIAGNOSIS — Z7902 Long term (current) use of antithrombotics/antiplatelets: Secondary | ICD-10-CM | POA: Diagnosis not present

## 2024-01-25 DIAGNOSIS — J984 Other disorders of lung: Secondary | ICD-10-CM | POA: Diagnosis not present

## 2024-01-25 DIAGNOSIS — I2510 Atherosclerotic heart disease of native coronary artery without angina pectoris: Secondary | ICD-10-CM | POA: Diagnosis not present

## 2024-01-25 DIAGNOSIS — G4733 Obstructive sleep apnea (adult) (pediatric): Secondary | ICD-10-CM | POA: Diagnosis not present

## 2024-01-25 DIAGNOSIS — J449 Chronic obstructive pulmonary disease, unspecified: Secondary | ICD-10-CM | POA: Diagnosis not present

## 2024-01-25 DIAGNOSIS — Z86711 Personal history of pulmonary embolism: Secondary | ICD-10-CM | POA: Diagnosis not present

## 2024-01-25 DIAGNOSIS — R911 Solitary pulmonary nodule: Secondary | ICD-10-CM | POA: Diagnosis not present

## 2024-01-25 DIAGNOSIS — I440 Atrioventricular block, first degree: Secondary | ICD-10-CM | POA: Diagnosis not present

## 2024-01-25 DIAGNOSIS — E119 Type 2 diabetes mellitus without complications: Secondary | ICD-10-CM | POA: Diagnosis not present

## 2024-01-25 DIAGNOSIS — R079 Chest pain, unspecified: Secondary | ICD-10-CM | POA: Diagnosis not present

## 2024-01-25 DIAGNOSIS — Z951 Presence of aortocoronary bypass graft: Secondary | ICD-10-CM | POA: Diagnosis not present

## 2024-01-25 DIAGNOSIS — I10 Essential (primary) hypertension: Secondary | ICD-10-CM | POA: Diagnosis not present

## 2024-01-25 DIAGNOSIS — I214 Non-ST elevation (NSTEMI) myocardial infarction: Secondary | ICD-10-CM | POA: Diagnosis not present

## 2024-01-25 DIAGNOSIS — Z79899 Other long term (current) drug therapy: Secondary | ICD-10-CM | POA: Diagnosis not present

## 2024-01-25 DIAGNOSIS — K219 Gastro-esophageal reflux disease without esophagitis: Secondary | ICD-10-CM | POA: Diagnosis not present

## 2024-01-25 DIAGNOSIS — Z9889 Other specified postprocedural states: Secondary | ICD-10-CM | POA: Diagnosis not present

## 2024-01-25 DIAGNOSIS — F17210 Nicotine dependence, cigarettes, uncomplicated: Secondary | ICD-10-CM | POA: Diagnosis not present

## 2024-01-25 DIAGNOSIS — J479 Bronchiectasis, uncomplicated: Secondary | ICD-10-CM | POA: Diagnosis not present

## 2024-01-25 DIAGNOSIS — R846 Abnormal cytological findings in specimens from respiratory organs and thorax: Secondary | ICD-10-CM | POA: Diagnosis not present

## 2024-01-25 DIAGNOSIS — Z7982 Long term (current) use of aspirin: Secondary | ICD-10-CM | POA: Diagnosis not present

## 2024-01-25 DIAGNOSIS — Z7901 Long term (current) use of anticoagulants: Secondary | ICD-10-CM | POA: Diagnosis not present

## 2024-01-26 ENCOUNTER — Ambulatory Visit: Admitting: Cardiology

## 2024-01-28 DIAGNOSIS — J449 Chronic obstructive pulmonary disease, unspecified: Secondary | ICD-10-CM | POA: Diagnosis not present

## 2024-01-28 DIAGNOSIS — G4733 Obstructive sleep apnea (adult) (pediatric): Secondary | ICD-10-CM | POA: Diagnosis not present

## 2024-01-28 DIAGNOSIS — F17210 Nicotine dependence, cigarettes, uncomplicated: Secondary | ICD-10-CM | POA: Diagnosis not present

## 2024-01-28 DIAGNOSIS — R918 Other nonspecific abnormal finding of lung field: Secondary | ICD-10-CM | POA: Diagnosis not present

## 2024-01-28 DIAGNOSIS — J479 Bronchiectasis, uncomplicated: Secondary | ICD-10-CM | POA: Diagnosis not present

## 2024-02-02 ENCOUNTER — Ambulatory Visit
Admission: RE | Admit: 2024-02-02 | Discharge: 2024-02-02 | Disposition: A | Discharge status: Home / Self Care | Source: Ambulatory Visit | Attending: Orthopedic Surgery

## 2024-02-02 DIAGNOSIS — M19011 Primary osteoarthritis, right shoulder: Secondary | ICD-10-CM | POA: Diagnosis not present

## 2024-02-02 DIAGNOSIS — M25511 Pain in right shoulder: Secondary | ICD-10-CM | POA: Diagnosis not present

## 2024-02-08 ENCOUNTER — Encounter: Payer: Self-pay | Admitting: Cardiology

## 2024-02-08 ENCOUNTER — Ambulatory Visit: Attending: Cardiology | Admitting: Cardiology

## 2024-02-08 VITALS — BP 118/74 | HR 90 | Ht 69.0 in | Wt 186.0 lb

## 2024-02-08 DIAGNOSIS — R0609 Other forms of dyspnea: Secondary | ICD-10-CM

## 2024-02-08 DIAGNOSIS — I10 Essential (primary) hypertension: Secondary | ICD-10-CM

## 2024-02-08 DIAGNOSIS — I2510 Atherosclerotic heart disease of native coronary artery without angina pectoris: Secondary | ICD-10-CM

## 2024-02-08 DIAGNOSIS — E119 Type 2 diabetes mellitus without complications: Secondary | ICD-10-CM

## 2024-02-08 DIAGNOSIS — J449 Chronic obstructive pulmonary disease, unspecified: Secondary | ICD-10-CM

## 2024-02-08 NOTE — Patient Instructions (Signed)
Medication Instructions:  Your physician recommends that you continue on your current medications as directed. Please refer to the Current Medication list given to you today.  *If you need a refill on your cardiac medications before your next appointment, please call your pharmacy*   Lab Work: None Ordered If you have labs (blood work) drawn today and your tests are completely normal, you will receive your results only by: [place] (if you have MyChart) OR A paper copy in the mail If you have any lab test that is abnormal or we need to change your treatment, we will call you to review the results.   Testing/Procedures: Your physician has requested that you have a lexiscan myoview. For further information please visit [email or web address]. Please follow instruction sheet, as given.  The test will take approximately 3 to 4 hours to complete; you may bring reading material.  If someone comes with you to your appointment, they will need to remain in the main lobby due to limited space in the testing area.  How to prepare for your Myocardial Perfusion Test: Do not eat or drink 3 hours prior to your test, except you may have water. Do not consume products containing caffeine (regular or decaffeinated) 12 hours prior to your test. (ex: coffee, chocolate, sodas, tea). Do bring a list of your current medications with you.  If not listed below, you may take your medications as normal. Do wear comfortable clothes (no dresses or overalls) and walking shoes, tennis shoes preferred (No heels or open toe shoes are allowed). Do NOT wear cologne, perfume, aftershave, or lotions (deodorant is allowed). If these instructions are not followed, your test will have to be rescheduled.     Follow-Up: At [facility], you and your health needs are our priority.  As part of our continuing mission to provide you with exceptional heart care, we have created designated Provider Care Teams.  These Care Teams  include your primary Cardiologist (physician) and Advanced Practice Providers (APPs -  Physician Assistants and Nurse Practitioners) who all work together to provide you with the care you need, when you need it.  We recommend signing up for the patient portal called "MyChart".  Sign up information is provided on this After Visit Summary.  MyChart is used to connect with patients for Virtual Visits (Telemedicine).  Patients are able to view lab/test results, encounter notes, upcoming appointments, etc.  Non-urgent messages can be sent to your provider as well.   To learn more about what you can do with MyChart, go to [email or web address].    Your next appointment:   5 month(s)  The format for your next appointment:   In Person  Provider:   Jenne Campus, MD    Other Instructions NA

## 2024-02-08 NOTE — Progress Notes (Signed)
 Cardiology Office Note:    Date:  02/08/2024   ID:  Matthew Adams, DOB 09/01/1961, MRN [id number]  PCP:  Monalisa Angles, PA  Cardiologist:  Ralene Burger, MD    Referring MD: Monalisa Angles, [place]   Chief Complaint  Patient presents with   Medical Clearance    Dr. Goldie Later Shoulder replacement    History of Present Illness:    Matthew Adams is a 63 y.o. male past medical history significant for coronary disease in 2016 who had PTCA and stenting done in 2016 and then having coronary bypass graft with sequential SVG to obtuse marginal and PDA, SVG to LAD, free LIMA to diagonal 1 as Y graft of SVG.  In September 2022 he Cardiac catheterization which showed completely occluded free LIMA as well as SVG to LAD.  Additional problem include COPD smoking history of pulmonary emboli is anticoagulated dyslipidemia smoking with sadly still ongoing.  Comes today to my office for follow-up for doing well but he is scheduled to have right shoulder surgery.  Mobility makes her somewhat limited because of multiple orthopedic issues.  Past Medical History:  Diagnosis Date   Abnormal ECG 08/09/2020   Abnormal nuclear stress test 12/25/2017   Acute diffuse otitis externa of left ear 01/08/2023   Angina pectoris (HCC) 08/04/2022   Antiphospholipid antibody syndrome (HCC) 08/04/2022   Formatting of this note might be different from the original. On chronic anticoagulation. Last PE 2011   Arthritis    hips   Atherosclerotic heart disease of native coronary artery without angina pectoris 05/18/2013   Formatting of this note might be different from the original. 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 COPD, FEV1 is 54% predicted and DLCO is 70% predicted.   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 s/p ATB   History of pulmonary embolism 12/30/2011   Hx of CABG 06/04/2015   Formatting of this note might be different from the original. 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/05/2021   Pneumonia 07/2020   Preop cardiovascular exam 08/13/2020   Pulmonary cavitary lesion 12/30/2011   Pulmonary embolism (HCC)    Secondary adrenal insufficiency (HCC) 08/05/2017   Secondary  hypothyroidism 01/05/2016   Severe chronic obstructive pulmonary disease (HCC) 12/30/2011   IMO SNOMED Dx Update Oct 2024     Sleep apnea    Smoking 08/05/2022   Type 2 diabetes mellitus without complication (HCC) 06/04/2015    Past Surgical History:  Procedure Laterality Date   APPENDECTOMY     CARDIAC CATHETERIZATION  2019   1 stent   CORONARY ARTERY BYPASS GRAFT  2014   TOTAL HIP ARTHROPLASTY Right 09/19/2020   Procedure: TOTAL HIP ARTHROPLASTY ANTERIOR APPROACH;  Surgeon: Adonica Hoose, MD;   Location: [facility];  Service: Orthopedics;  Laterality: Right;   TRANSPHENOIDAL PITUITARY RESECTION  2003   recurrance treated with gamma knife    Current Medications: Current Meds  Medication Sig   albuterol  (PROVENTIL ) (2.5 MG/3ML) 0.083% nebulizer solution Take 2.5 mg by nebulization every 6 (six) hours as needed for wheezing or shortness of breath.   albuterol  (VENTOLIN  HFA) 108 (90 Base) MCG/ACT inhaler Inhale 1-2 puffs into the lungs every 6 (six) hours as needed for wheezing or shortness of breath.   atorvastatin  (LIPITOR) 80 MG tablet Take 1 tablet (80 mg total) by mouth daily.   Bempedoic Acid-Ezetimibe  (NEXLIZET ) 180-10 MG TABS Take 1 tablet by mouth daily. Stop taking Ezetimibe  then start this medication   clopidogrel  (PLAVIX ) 75 MG tablet Take 1 tablet (75 mg total) by mouth daily.   docusate sodium  (COLACE) 100 MG capsule Take 1 capsule (100 mg total) by mouth 2 (two) times daily.   fenofibrate (TRICOR) 145 MG tablet Take 145 mg by mouth daily.   Fluticasone-Salmeterol (ADVAIR) 500-50 MCG/DOSE AEPB Inhale 1 puff into the lungs 2 (two) times daily.   gabapentin  (NEURONTIN ) 300 MG capsule Take 300 mg by mouth at bedtime.   hydrocortisone  (CORTEF ) 10 MG tablet Take 5-10 mg by mouth See admin instructions. Take 10 mg by mouth in the morning and 5 mg in the evening   levothyroxine  (SYNTHROID ) 100 MCG tablet Take 100 mcg by mouth daily before breakfast.   metoprolol  tartrate (LOPRESSOR ) 25 MG tablet Take 12.5 mg by mouth daily.   nitroGLYCERIN  (NITROSTAT ) 0.4 MG SL tablet Place 1 tablet (0.4 mg total) under the tongue every 5 (five) minutes x 3 doses as needed for chest pain.   pantoprazole  (PROTONIX ) 40 MG tablet Take 40 mg by mouth at bedtime.   ranolazine  (RANEXA ) 1000 MG SR tablet Take 1 tablet (1,000 mg total) by mouth 2 (two) times daily.   senna (SENOKOT) 8.6 MG TABS tablet Take 2 tablets (17.2 mg total) by mouth at bedtime.   sildenafil  (REVATIO ) 20 MG tablet Take 20-100 mg by  mouth daily as needed for erectile dysfunction.   simvastatin  (ZOCOR ) 40 MG tablet Take 40 mg by mouth daily.   SPIRIVA  RESPIMAT 2.5 MCG/ACT AERS Inhale 1 puff into the lungs in the morning and at bedtime.   tamsulosin  (FLOMAX ) 0.4 MG CAPS capsule Take 0.4 mg by mouth at bedtime.    Testosterone  20.25 MG/ACT (1.62%) GEL Apply 3 Pump topically at bedtime. AFTER SHOWERING   XARELTO  15 MG TABS tablet Take 15 mg by mouth daily after supper.     Allergies:   Patient has no known allergies.   Social History   Socioeconomic History   Marital status: Divorced    Spouse name: Not on file   Number of children: Not on file   Years of education: Not on file   Highest education level: Not on file  Occupational History   Not on file  Tobacco Use   Smoking status: Every Day    Current packs/day: 0.00    Average packs/day: 0.3 packs/day for 25.0 years (6.3 ttl pk-yrs)    Types: Cigarettes    Start date: 11/09/1986    Last attempt to quit: 11/10/2011    Years since quitting: 12.2   Smokeless tobacco: Never  Vaping Use   Vaping status: Never Used  Substance and Sexual Activity   Alcohol  use: No   Drug use: No   Sexual activity: Not on file  Other Topics Concern   Not on file  Social History Narrative   Not on file   Social Drivers of Health   Financial Resource Strain: Not on file  Food Insecurity: Not on file  Transportation Needs: Not on file  Physical Activity: Not on file  Stress: Not on file  Social Connections: Not on file     Family History: The patient's family history includes Heart disease in his father. ROS:   Please see the history of present illness.    All 14 point review of systems negative except as described per history of present illness  EKGs/Labs/Other Studies Reviewed:    EKG Interpretation Date/Time:  Monday February 08 2024 15:47:14 EDT Ventricular Rate:  90 PR Interval:  284 QRS Duration:  72 QT Interval:  326 QTC Calculation: 398 R  Axis:   -71  Text Interpretation: Sinus rhythm with 1st degree A-V block Left axis deviation Low voltage QRS Inferior infarct , age undetermined Cannot rule out Anterior infarct , age undetermined Abnormal ECG When compared with ECG of 30-May-2021 09:29, QRS axis Shifted left Minimal criteria for Anterior infarct are now Present Inferior infarct is now Present ST now depressed in Inferior leads Confirmed by Ralene Burger [phone]) on 02/08/2024 3:56:29 PM    Recent Labs: 09/21/2023: ALT 14  Recent Lipid Panel    Component Value Date/Time   CHOL 154 09/21/2023 0839   TRIG 116 09/21/2023 0839   HDL 46 09/21/2023 0839   CHOLHDL 3.3 09/21/2023 0839   LDLCALC 87 09/21/2023 0839    Physical Exam:    VS:  BP 118/74 (BP Location: Right Arm, Patient Position: Sitting, Cuff Size: Small)   Pulse 90   Ht 5\' 9"  (1.753 m)   Wt 186 lb (84.4 kg)   SpO2 96%   BMI 27.47 kg/m     Wt Readings from Last 3 Encounters:  02/08/24 186 lb (84.4 kg)  07/30/23 187 lb 3.2 oz (84.9 kg)  01/23/23 188 lb (85.3 kg)     GEN:  Well nourished, well developed in no acute distress HEENT: Normal NECK: No JVD; No carotid bruits LYMPHATICS: No lymphadenopathy CARDIAC: RRR, no murmurs, no rubs, no gallops RESPIRATORY:  Clear to auscultation without rales, wheezing or rhonchi  ABDOMEN: Soft, non-tender, non-distended MUSCULOSKELETAL:  No edema; No deformity  SKIN: Warm and dry LOWER EXTREMITIES: no swelling NEUROLOGIC:  Alert and oriented x 3 PSYCHIATRIC:  Normal affect   ASSESSMENT:    1. Primary hypertension   2. Dyspnea on exertion   3. Atherosclerosis of native coronary artery of native heart without angina pectoris   4. Severe chronic obstructive pulmonary disease (HCC)   5. Type 2 diabetes mellitus without complication, without long-term current use of insulin (HCC)    PLAN:    In order of problems listed above:  Cardiovascular preop evaluation for this gentleman for right shoulder surgery.   Schedule him to have a stress test to make sure he does not  have any inducible ischemia. Dyspnea on exertion multifactorial COPD play well.  Also he Diminished left ventricular ejection fraction and difficulty tolerating ACE inhibitor or ARB Entresto because of kidney dysfunction continue monitoring. Type 2 diabetes followed by internal medicine team, I did review his K PN which show me his hemoglobin A1c 5.8. Dyslipidemia I did review K PN which show LDL 39 HDL 25 this is from February 25 continue present management   Medication Adjustments/Labs and Tests Ordered: Current medicines are reviewed at length with the patient today.  Concerns regarding medicines are outlined above.  Orders Placed This Encounter  Procedures   MYOCARDIAL PERFUSION IMAGING   EKG 12-Lead   Medication changes: No orders of the defined types were placed in this encounter.   Signed, Manfred Seed, MD, [facility] 02/08/2024 4:11 PM    [facility]

## 2024-02-10 DIAGNOSIS — R918 Other nonspecific abnormal finding of lung field: Secondary | ICD-10-CM | POA: Diagnosis not present

## 2024-02-10 DIAGNOSIS — C3411 Malignant neoplasm of upper lobe, right bronchus or lung: Secondary | ICD-10-CM | POA: Diagnosis not present

## 2024-02-14 DIAGNOSIS — Z792 Long term (current) use of antibiotics: Secondary | ICD-10-CM | POA: Diagnosis not present

## 2024-02-14 DIAGNOSIS — Z7982 Long term (current) use of aspirin: Secondary | ICD-10-CM | POA: Diagnosis not present

## 2024-02-14 DIAGNOSIS — H00031 Abscess of right upper eyelid: Secondary | ICD-10-CM | POA: Diagnosis not present

## 2024-02-14 DIAGNOSIS — L0201 Cutaneous abscess of face: Secondary | ICD-10-CM | POA: Diagnosis not present

## 2024-02-14 DIAGNOSIS — R220 Localized swelling, mass and lump, head: Secondary | ICD-10-CM | POA: Diagnosis not present

## 2024-02-14 DIAGNOSIS — L03213 Periorbital cellulitis: Secondary | ICD-10-CM | POA: Diagnosis not present

## 2024-02-14 DIAGNOSIS — Z79899 Other long term (current) drug therapy: Secondary | ICD-10-CM | POA: Diagnosis not present

## 2024-02-16 DIAGNOSIS — L0201 Cutaneous abscess of face: Secondary | ICD-10-CM | POA: Diagnosis not present

## 2024-02-16 DIAGNOSIS — L0390 Cellulitis, unspecified: Secondary | ICD-10-CM | POA: Diagnosis not present

## 2024-02-19 DIAGNOSIS — Z1211 Encounter for screening for malignant neoplasm of colon: Secondary | ICD-10-CM | POA: Diagnosis not present

## 2024-02-19 DIAGNOSIS — Z79899 Other long term (current) drug therapy: Secondary | ICD-10-CM | POA: Diagnosis not present

## 2024-02-19 DIAGNOSIS — L03213 Periorbital cellulitis: Secondary | ICD-10-CM | POA: Diagnosis not present

## 2024-02-24 ENCOUNTER — Telehealth: Payer: Self-pay | Admitting: *Deleted

## 2024-02-24 NOTE — Telephone Encounter (Signed)
 Spoke to patient and he was given detailed instructions about his STRESS TEST on 03/01/24 at 8:00.

## 2024-02-25 DIAGNOSIS — Z1211 Encounter for screening for malignant neoplasm of colon: Secondary | ICD-10-CM | POA: Diagnosis not present

## 2024-02-25 DIAGNOSIS — Z1212 Encounter for screening for malignant neoplasm of rectum: Secondary | ICD-10-CM | POA: Diagnosis not present

## 2024-02-29 DIAGNOSIS — E2749 Other adrenocortical insufficiency: Secondary | ICD-10-CM | POA: Diagnosis not present

## 2024-02-29 DIAGNOSIS — E038 Other specified hypothyroidism: Secondary | ICD-10-CM | POA: Diagnosis not present

## 2024-03-01 ENCOUNTER — Ambulatory Visit: Attending: Cardiology

## 2024-03-01 DIAGNOSIS — R0609 Other forms of dyspnea: Secondary | ICD-10-CM | POA: Diagnosis not present

## 2024-03-01 MED ORDER — TECHNETIUM TC 99M TETROFOSMIN IV KIT
10.5000 | PACK | Freq: Once | INTRAVENOUS | Status: AC | PRN
  Administered 2024-03-01: 10.5 via INTRAVENOUS

## 2024-03-01 MED ORDER — TECHNETIUM TC 99M TETROFOSMIN IV KIT
31.3000 | PACK | Freq: Once | INTRAVENOUS | Status: AC | PRN
  Administered 2024-03-01: 31.3 via INTRAVENOUS

## 2024-03-01 MED ORDER — REGADENOSON 0.4 MG/5ML IV SOLN
0.4000 mg | Freq: Once | INTRAVENOUS | Status: AC
  Administered 2024-03-01: 0.4 mg via INTRAVENOUS

## 2024-03-03 ENCOUNTER — Ambulatory Visit: Payer: Self-pay | Admitting: Cardiology

## 2024-03-03 LAB — MYOCARDIAL PERFUSION IMAGING
LV dias vol: 130 mL (ref 62–150)
LV sys vol: 74 mL
Nuc Stress EF: 43 %
Peak HR: 90 {beats}/min
Rest HR: 80 {beats}/min
Rest Nuclear Isotope Dose: 10.5 mCi
SDS: 4
SRS: 17
SSS: 21
ST Depression (mm): 0 mm
Stress Nuclear Isotope Dose: 31.3 mCi
TID: 0.92

## 2024-03-10 DIAGNOSIS — G4733 Obstructive sleep apnea (adult) (pediatric): Secondary | ICD-10-CM | POA: Diagnosis not present

## 2024-03-10 DIAGNOSIS — J449 Chronic obstructive pulmonary disease, unspecified: Secondary | ICD-10-CM | POA: Diagnosis not present

## 2024-03-10 DIAGNOSIS — F17210 Nicotine dependence, cigarettes, uncomplicated: Secondary | ICD-10-CM | POA: Diagnosis not present

## 2024-03-10 DIAGNOSIS — R918 Other nonspecific abnormal finding of lung field: Secondary | ICD-10-CM | POA: Diagnosis not present

## 2024-03-21 ENCOUNTER — Telehealth: Payer: Self-pay

## 2024-03-21 NOTE — Telephone Encounter (Signed)
 Stress Test Results reviewed with pt as per Dr. Vanetta Shawl note.  Pt verbalized understanding and had no additional questions. Routed to PCP

## 2024-03-23 DIAGNOSIS — R918 Other nonspecific abnormal finding of lung field: Secondary | ICD-10-CM | POA: Diagnosis not present

## 2024-03-25 DIAGNOSIS — J432 Centrilobular emphysema: Secondary | ICD-10-CM | POA: Diagnosis not present

## 2024-03-25 DIAGNOSIS — R918 Other nonspecific abnormal finding of lung field: Secondary | ICD-10-CM | POA: Diagnosis not present

## 2024-03-28 DIAGNOSIS — R918 Other nonspecific abnormal finding of lung field: Secondary | ICD-10-CM | POA: Diagnosis not present

## 2024-03-28 DIAGNOSIS — F17210 Nicotine dependence, cigarettes, uncomplicated: Secondary | ICD-10-CM | POA: Diagnosis not present

## 2024-03-31 DIAGNOSIS — K2100 Gastro-esophageal reflux disease with esophagitis, without bleeding: Secondary | ICD-10-CM | POA: Diagnosis not present

## 2024-03-31 DIAGNOSIS — K5900 Constipation, unspecified: Secondary | ICD-10-CM | POA: Diagnosis not present

## 2024-03-31 DIAGNOSIS — K2270 Barrett's esophagus without dysplasia: Secondary | ICD-10-CM | POA: Diagnosis not present

## 2024-03-31 DIAGNOSIS — K648 Other hemorrhoids: Secondary | ICD-10-CM | POA: Diagnosis not present

## 2024-03-31 DIAGNOSIS — R195 Other fecal abnormalities: Secondary | ICD-10-CM | POA: Diagnosis not present

## 2024-03-31 DIAGNOSIS — K5790 Diverticulosis of intestine, part unspecified, without perforation or abscess without bleeding: Secondary | ICD-10-CM | POA: Diagnosis not present

## 2024-03-31 DIAGNOSIS — R1310 Dysphagia, unspecified: Secondary | ICD-10-CM | POA: Diagnosis not present

## 2024-03-31 DIAGNOSIS — R7989 Other specified abnormal findings of blood chemistry: Secondary | ICD-10-CM | POA: Diagnosis not present

## 2024-04-06 ENCOUNTER — Telehealth: Payer: Self-pay

## 2024-04-06 NOTE — Telephone Encounter (Signed)
   Pre-operative Risk Assessment    Patient Name: Matthew Adams  DOB: 1961-01-22 MRN: [id number]   Date of last office visit: 02/08/24 Date of next office visit: N/A   Request for Surgical Clearance    Procedure:  Colonoscopy and EGD  Date of Surgery:  Clearance TBD                                Surgeon:   Surgeon's Group or Practice Name:  [facility] Phone number:  [phone] Fax number:  [phone]   Type of Clearance Requested:   - Pharmacy:  Hold Aspirin, Clopidogrel  (Plavix ), and Rivaroxaban  (Xarelto ) Hold ASA and Plavix  5 days prior and Xarelto  2 days prior   Type of Anesthesia:  Not Indicated   Additional requests/questions:    Signed, Annabella LITTIE Sayres   04/06/2024, 12:12 PM

## 2024-04-06 NOTE — Telephone Encounter (Signed)
 Clearance notes have been faxed to requesting office. See notes from preop APP Scot Ford, in regard to blood thinners.

## 2024-04-06 NOTE — Telephone Encounter (Signed)
   Patient Name: Matthew Adams  DOB: [birth year] MRN: [id number]  Primary Cardiologist: None  Chart reviewed as part of pre-operative protocol coverage. Given past medical history and time since last visit, based on ACC/AHA guidelines, DRAPER GALLON is at acceptable risk for the planned procedure without further cardiovascular testing.   Recent stress test in May 2025 was reassuring, showed known scar from the previous infarction but no reversible ischemia.  He may hold the Plavix  for 5 days prior to the upcoming procedure.  Patient is on Xarelto  for history of PE but not for cardiac reason.  Xarelto  not prescribed by cardiology service, will defer to PCP regarding holding time of Xarelto .  I will route this recommendation to the requesting party via Epic fax function and remove from pre-op pool.  Please call with questions.  Ratasha Fabre, [place] 04/06/2024, 1:40 PM

## 2024-04-19 ENCOUNTER — Telehealth: Payer: Self-pay | Admitting: Pharmacy Technician

## 2024-04-19 NOTE — Telephone Encounter (Signed)
 Pharmacy Patient Advocate Encounter   Received notification from Onbase that prior authorization for nexlizet  is required/requested.   Insurance verification completed.   The patient is insured through [facility] .   Per test claim: PA required; PA submitted to above mentioned insurance via CoverMyMeds Key/confirmation #/EOC [id number] Status is pending   Current pa good until 05/18/24. No new labs since starting nexlizet 

## 2024-04-20 NOTE — Telephone Encounter (Signed)
 [employer] calling to make a nurse aware that forms being sent out today must be sent back by 04/22/2024. Case Number: [id number]

## 2024-04-20 NOTE — Telephone Encounter (Signed)
 Pharmacy Patient Advocate Encounter  Received notification from [facility] that Prior Authorization for nexlizet  has been APPROVED from 04/19/24 to 10/12/24  pt just got 04/06/24 30ds

## 2024-04-26 ENCOUNTER — Other Ambulatory Visit: Payer: Self-pay | Admitting: Cardiology

## 2024-04-26 DIAGNOSIS — E039 Hypothyroidism, unspecified: Secondary | ICD-10-CM | POA: Diagnosis not present

## 2024-04-26 DIAGNOSIS — Z8719 Personal history of other diseases of the digestive system: Secondary | ICD-10-CM | POA: Diagnosis not present

## 2024-04-26 DIAGNOSIS — Z951 Presence of aortocoronary bypass graft: Secondary | ICD-10-CM | POA: Diagnosis not present

## 2024-04-26 DIAGNOSIS — M1990 Unspecified osteoarthritis, unspecified site: Secondary | ICD-10-CM | POA: Diagnosis not present

## 2024-04-26 DIAGNOSIS — K2210 Ulcer of esophagus without bleeding: Secondary | ICD-10-CM | POA: Diagnosis not present

## 2024-04-26 DIAGNOSIS — I252 Old myocardial infarction: Secondary | ICD-10-CM | POA: Diagnosis not present

## 2024-04-26 DIAGNOSIS — I110 Hypertensive heart disease with heart failure: Secondary | ICD-10-CM | POA: Diagnosis not present

## 2024-04-26 DIAGNOSIS — K2270 Barrett's esophagus without dysplasia: Secondary | ICD-10-CM | POA: Diagnosis not present

## 2024-04-26 DIAGNOSIS — Z800 Family history of malignant neoplasm of digestive organs: Secondary | ICD-10-CM | POA: Diagnosis not present

## 2024-04-26 DIAGNOSIS — Z79899 Other long term (current) drug therapy: Secondary | ICD-10-CM | POA: Diagnosis not present

## 2024-04-26 DIAGNOSIS — K5750 Diverticulosis of both small and large intestine without perforation or abscess without bleeding: Secondary | ICD-10-CM | POA: Diagnosis not present

## 2024-04-26 DIAGNOSIS — Z860100 Personal history of colon polyps, unspecified: Secondary | ICD-10-CM | POA: Diagnosis not present

## 2024-04-26 DIAGNOSIS — G4733 Obstructive sleep apnea (adult) (pediatric): Secondary | ICD-10-CM | POA: Diagnosis not present

## 2024-04-26 DIAGNOSIS — I509 Heart failure, unspecified: Secondary | ICD-10-CM | POA: Diagnosis not present

## 2024-04-26 DIAGNOSIS — R1310 Dysphagia, unspecified: Secondary | ICD-10-CM | POA: Diagnosis not present

## 2024-04-26 DIAGNOSIS — Z7982 Long term (current) use of aspirin: Secondary | ICD-10-CM | POA: Diagnosis not present

## 2024-04-26 DIAGNOSIS — K449 Diaphragmatic hernia without obstruction or gangrene: Secondary | ICD-10-CM | POA: Diagnosis not present

## 2024-04-26 DIAGNOSIS — I2510 Atherosclerotic heart disease of native coronary artery without angina pectoris: Secondary | ICD-10-CM | POA: Diagnosis not present

## 2024-04-26 DIAGNOSIS — K5909 Other constipation: Secondary | ICD-10-CM | POA: Diagnosis not present

## 2024-04-26 DIAGNOSIS — Z86711 Personal history of pulmonary embolism: Secondary | ICD-10-CM | POA: Diagnosis not present

## 2024-04-26 DIAGNOSIS — J449 Chronic obstructive pulmonary disease, unspecified: Secondary | ICD-10-CM | POA: Diagnosis not present

## 2024-04-26 DIAGNOSIS — E785 Hyperlipidemia, unspecified: Secondary | ICD-10-CM | POA: Diagnosis not present

## 2024-04-26 DIAGNOSIS — K219 Gastro-esophageal reflux disease without esophagitis: Secondary | ICD-10-CM | POA: Diagnosis not present

## 2024-04-26 DIAGNOSIS — K648 Other hemorrhoids: Secondary | ICD-10-CM | POA: Diagnosis not present

## 2024-04-26 DIAGNOSIS — R195 Other fecal abnormalities: Secondary | ICD-10-CM | POA: Diagnosis not present

## 2024-04-26 DIAGNOSIS — J439 Emphysema, unspecified: Secondary | ICD-10-CM | POA: Diagnosis not present

## 2024-04-26 DIAGNOSIS — I10 Essential (primary) hypertension: Secondary | ICD-10-CM | POA: Diagnosis not present

## 2024-04-26 DIAGNOSIS — Z8673 Personal history of transient ischemic attack (TIA), and cerebral infarction without residual deficits: Secondary | ICD-10-CM | POA: Diagnosis not present

## 2024-04-26 DIAGNOSIS — K222 Esophageal obstruction: Secondary | ICD-10-CM | POA: Diagnosis not present

## 2024-05-05 DIAGNOSIS — J479 Bronchiectasis, uncomplicated: Secondary | ICD-10-CM | POA: Diagnosis not present

## 2024-05-05 DIAGNOSIS — R918 Other nonspecific abnormal finding of lung field: Secondary | ICD-10-CM | POA: Diagnosis not present

## 2024-05-05 DIAGNOSIS — G4733 Obstructive sleep apnea (adult) (pediatric): Secondary | ICD-10-CM | POA: Diagnosis not present

## 2024-05-05 DIAGNOSIS — J449 Chronic obstructive pulmonary disease, unspecified: Secondary | ICD-10-CM | POA: Diagnosis not present

## 2024-05-30 DIAGNOSIS — E038 Other specified hypothyroidism: Secondary | ICD-10-CM | POA: Diagnosis not present

## 2024-05-30 DIAGNOSIS — I10 Essential (primary) hypertension: Secondary | ICD-10-CM | POA: Diagnosis not present

## 2024-05-30 DIAGNOSIS — I252 Old myocardial infarction: Secondary | ICD-10-CM | POA: Diagnosis not present

## 2024-05-30 DIAGNOSIS — E782 Mixed hyperlipidemia: Secondary | ICD-10-CM | POA: Diagnosis not present

## 2024-05-30 DIAGNOSIS — R590 Localized enlarged lymph nodes: Secondary | ICD-10-CM | POA: Diagnosis not present

## 2024-05-30 DIAGNOSIS — E785 Hyperlipidemia, unspecified: Secondary | ICD-10-CM | POA: Diagnosis not present

## 2024-05-30 DIAGNOSIS — Z86018 Personal history of other benign neoplasm: Secondary | ICD-10-CM | POA: Diagnosis not present

## 2024-05-30 DIAGNOSIS — I700 Atherosclerosis of aorta: Secondary | ICD-10-CM | POA: Diagnosis not present

## 2024-05-30 DIAGNOSIS — J439 Emphysema, unspecified: Secondary | ICD-10-CM | POA: Diagnosis not present

## 2024-05-30 DIAGNOSIS — R7303 Prediabetes: Secondary | ICD-10-CM | POA: Diagnosis not present

## 2024-05-30 DIAGNOSIS — Z951 Presence of aortocoronary bypass graft: Secondary | ICD-10-CM | POA: Diagnosis not present

## 2024-05-30 DIAGNOSIS — I2510 Atherosclerotic heart disease of native coronary artery without angina pectoris: Secondary | ICD-10-CM | POA: Diagnosis not present

## 2024-05-31 DIAGNOSIS — K5790 Diverticulosis of intestine, part unspecified, without perforation or abscess without bleeding: Secondary | ICD-10-CM | POA: Diagnosis not present

## 2024-05-31 DIAGNOSIS — R079 Chest pain, unspecified: Secondary | ICD-10-CM | POA: Diagnosis not present

## 2024-05-31 DIAGNOSIS — I959 Hypotension, unspecified: Secondary | ICD-10-CM | POA: Diagnosis not present

## 2024-05-31 DIAGNOSIS — D649 Anemia, unspecified: Secondary | ICD-10-CM | POA: Diagnosis not present

## 2024-05-31 DIAGNOSIS — A419 Sepsis, unspecified organism: Secondary | ICD-10-CM | POA: Diagnosis not present

## 2024-05-31 DIAGNOSIS — E039 Hypothyroidism, unspecified: Secondary | ICD-10-CM | POA: Diagnosis not present

## 2024-05-31 DIAGNOSIS — Z452 Encounter for adjustment and management of vascular access device: Secondary | ICD-10-CM | POA: Diagnosis not present

## 2024-05-31 DIAGNOSIS — R531 Weakness: Secondary | ICD-10-CM | POA: Diagnosis not present

## 2024-06-01 DIAGNOSIS — R079 Chest pain, unspecified: Secondary | ICD-10-CM | POA: Diagnosis not present

## 2024-06-01 DIAGNOSIS — I509 Heart failure, unspecified: Secondary | ICD-10-CM | POA: Diagnosis not present

## 2024-06-01 DIAGNOSIS — K921 Melena: Secondary | ICD-10-CM | POA: Diagnosis not present

## 2024-06-03 DIAGNOSIS — I454 Nonspecific intraventricular block: Secondary | ICD-10-CM | POA: Diagnosis not present

## 2024-06-03 DIAGNOSIS — I4892 Unspecified atrial flutter: Secondary | ICD-10-CM | POA: Diagnosis not present

## 2024-06-04 DIAGNOSIS — R1030 Lower abdominal pain, unspecified: Secondary | ICD-10-CM | POA: Diagnosis not present

## 2024-06-04 DIAGNOSIS — R140 Abdominal distension (gaseous): Secondary | ICD-10-CM | POA: Diagnosis not present

## 2024-06-05 DIAGNOSIS — K921 Melena: Secondary | ICD-10-CM | POA: Diagnosis not present

## 2024-06-05 DIAGNOSIS — R140 Abdominal distension (gaseous): Secondary | ICD-10-CM | POA: Diagnosis not present

## 2024-06-06 DIAGNOSIS — I509 Heart failure, unspecified: Secondary | ICD-10-CM | POA: Diagnosis not present

## 2024-06-06 DIAGNOSIS — R079 Chest pain, unspecified: Secondary | ICD-10-CM | POA: Diagnosis not present

## 2024-06-06 DIAGNOSIS — K921 Melena: Secondary | ICD-10-CM | POA: Diagnosis not present

## 2024-06-07 DIAGNOSIS — I509 Heart failure, unspecified: Secondary | ICD-10-CM | POA: Diagnosis not present

## 2024-06-07 DIAGNOSIS — R079 Chest pain, unspecified: Secondary | ICD-10-CM | POA: Diagnosis not present

## 2024-06-07 DIAGNOSIS — K921 Melena: Secondary | ICD-10-CM | POA: Diagnosis not present

## 2024-06-08 DIAGNOSIS — R079 Chest pain, unspecified: Secondary | ICD-10-CM | POA: Diagnosis not present

## 2024-06-08 DIAGNOSIS — K921 Melena: Secondary | ICD-10-CM | POA: Diagnosis not present

## 2024-06-08 DIAGNOSIS — I509 Heart failure, unspecified: Secondary | ICD-10-CM | POA: Diagnosis not present

## 2024-06-13 DIAGNOSIS — J440 Chronic obstructive pulmonary disease with acute lower respiratory infection: Secondary | ICD-10-CM | POA: Diagnosis not present

## 2024-06-13 DIAGNOSIS — J439 Emphysema, unspecified: Secondary | ICD-10-CM | POA: Diagnosis not present

## 2024-06-13 DIAGNOSIS — R6521 Severe sepsis with septic shock: Secondary | ICD-10-CM | POA: Diagnosis not present

## 2024-06-13 DIAGNOSIS — R911 Solitary pulmonary nodule: Secondary | ICD-10-CM | POA: Diagnosis not present

## 2024-06-13 DIAGNOSIS — G4733 Obstructive sleep apnea (adult) (pediatric): Secondary | ICD-10-CM | POA: Diagnosis not present

## 2024-06-13 DIAGNOSIS — I252 Old myocardial infarction: Secondary | ICD-10-CM | POA: Diagnosis not present

## 2024-06-13 DIAGNOSIS — D6861 Antiphospholipid syndrome: Secondary | ICD-10-CM | POA: Diagnosis not present

## 2024-06-13 DIAGNOSIS — N179 Acute kidney failure, unspecified: Secondary | ICD-10-CM | POA: Diagnosis not present

## 2024-06-13 DIAGNOSIS — I25110 Atherosclerotic heart disease of native coronary artery with unstable angina pectoris: Secondary | ICD-10-CM | POA: Diagnosis not present

## 2024-06-13 DIAGNOSIS — E272 Addisonian crisis: Secondary | ICD-10-CM | POA: Diagnosis not present

## 2024-06-13 DIAGNOSIS — I110 Hypertensive heart disease with heart failure: Secondary | ICD-10-CM | POA: Diagnosis not present

## 2024-06-13 DIAGNOSIS — M1990 Unspecified osteoarthritis, unspecified site: Secondary | ICD-10-CM | POA: Diagnosis not present

## 2024-06-13 DIAGNOSIS — K31811 Angiodysplasia of stomach and duodenum with bleeding: Secondary | ICD-10-CM | POA: Diagnosis not present

## 2024-06-13 DIAGNOSIS — I480 Paroxysmal atrial fibrillation: Secondary | ICD-10-CM | POA: Diagnosis not present

## 2024-06-13 DIAGNOSIS — I5043 Acute on chronic combined systolic (congestive) and diastolic (congestive) heart failure: Secondary | ICD-10-CM | POA: Diagnosis not present

## 2024-06-13 DIAGNOSIS — A419 Sepsis, unspecified organism: Secondary | ICD-10-CM | POA: Diagnosis not present

## 2024-06-13 DIAGNOSIS — E1142 Type 2 diabetes mellitus with diabetic polyneuropathy: Secondary | ICD-10-CM | POA: Diagnosis not present

## 2024-06-13 DIAGNOSIS — I700 Atherosclerosis of aorta: Secondary | ICD-10-CM | POA: Diagnosis not present

## 2024-06-13 DIAGNOSIS — E559 Vitamin D deficiency, unspecified: Secondary | ICD-10-CM | POA: Diagnosis not present

## 2024-06-13 DIAGNOSIS — R338 Other retention of urine: Secondary | ICD-10-CM | POA: Diagnosis not present

## 2024-06-13 DIAGNOSIS — I440 Atrioventricular block, first degree: Secondary | ICD-10-CM | POA: Diagnosis not present

## 2024-06-13 DIAGNOSIS — D500 Iron deficiency anemia secondary to blood loss (chronic): Secondary | ICD-10-CM | POA: Diagnosis not present

## 2024-06-14 DIAGNOSIS — I700 Atherosclerosis of aorta: Secondary | ICD-10-CM | POA: Diagnosis not present

## 2024-06-14 DIAGNOSIS — R911 Solitary pulmonary nodule: Secondary | ICD-10-CM | POA: Diagnosis not present

## 2024-06-14 DIAGNOSIS — D6861 Antiphospholipid syndrome: Secondary | ICD-10-CM | POA: Diagnosis not present

## 2024-06-14 DIAGNOSIS — D500 Iron deficiency anemia secondary to blood loss (chronic): Secondary | ICD-10-CM | POA: Diagnosis not present

## 2024-06-14 DIAGNOSIS — I480 Paroxysmal atrial fibrillation: Secondary | ICD-10-CM | POA: Diagnosis not present

## 2024-06-14 DIAGNOSIS — E272 Addisonian crisis: Secondary | ICD-10-CM | POA: Diagnosis not present

## 2024-06-14 DIAGNOSIS — R6521 Severe sepsis with septic shock: Secondary | ICD-10-CM | POA: Diagnosis not present

## 2024-06-14 DIAGNOSIS — I110 Hypertensive heart disease with heart failure: Secondary | ICD-10-CM | POA: Diagnosis not present

## 2024-06-14 DIAGNOSIS — I5043 Acute on chronic combined systolic (congestive) and diastolic (congestive) heart failure: Secondary | ICD-10-CM | POA: Diagnosis not present

## 2024-06-14 DIAGNOSIS — E1142 Type 2 diabetes mellitus with diabetic polyneuropathy: Secondary | ICD-10-CM | POA: Diagnosis not present

## 2024-06-14 DIAGNOSIS — I440 Atrioventricular block, first degree: Secondary | ICD-10-CM | POA: Diagnosis not present

## 2024-06-14 DIAGNOSIS — N179 Acute kidney failure, unspecified: Secondary | ICD-10-CM | POA: Diagnosis not present

## 2024-06-14 DIAGNOSIS — I25110 Atherosclerotic heart disease of native coronary artery with unstable angina pectoris: Secondary | ICD-10-CM | POA: Diagnosis not present

## 2024-06-14 DIAGNOSIS — M1990 Unspecified osteoarthritis, unspecified site: Secondary | ICD-10-CM | POA: Diagnosis not present

## 2024-06-14 DIAGNOSIS — J439 Emphysema, unspecified: Secondary | ICD-10-CM | POA: Diagnosis not present

## 2024-06-14 DIAGNOSIS — I252 Old myocardial infarction: Secondary | ICD-10-CM | POA: Diagnosis not present

## 2024-06-14 DIAGNOSIS — K31811 Angiodysplasia of stomach and duodenum with bleeding: Secondary | ICD-10-CM | POA: Diagnosis not present

## 2024-06-14 DIAGNOSIS — R338 Other retention of urine: Secondary | ICD-10-CM | POA: Diagnosis not present

## 2024-06-14 DIAGNOSIS — J440 Chronic obstructive pulmonary disease with acute lower respiratory infection: Secondary | ICD-10-CM | POA: Diagnosis not present

## 2024-06-14 DIAGNOSIS — A419 Sepsis, unspecified organism: Secondary | ICD-10-CM | POA: Diagnosis not present

## 2024-06-14 DIAGNOSIS — E559 Vitamin D deficiency, unspecified: Secondary | ICD-10-CM | POA: Diagnosis not present

## 2024-06-16 DIAGNOSIS — R6521 Severe sepsis with septic shock: Secondary | ICD-10-CM | POA: Diagnosis not present

## 2024-06-16 DIAGNOSIS — I480 Paroxysmal atrial fibrillation: Secondary | ICD-10-CM | POA: Diagnosis not present

## 2024-06-16 DIAGNOSIS — J439 Emphysema, unspecified: Secondary | ICD-10-CM | POA: Diagnosis not present

## 2024-06-16 DIAGNOSIS — I25110 Atherosclerotic heart disease of native coronary artery with unstable angina pectoris: Secondary | ICD-10-CM | POA: Diagnosis not present

## 2024-06-16 DIAGNOSIS — R338 Other retention of urine: Secondary | ICD-10-CM | POA: Diagnosis not present

## 2024-06-16 DIAGNOSIS — G4733 Obstructive sleep apnea (adult) (pediatric): Secondary | ICD-10-CM | POA: Diagnosis not present

## 2024-06-16 DIAGNOSIS — I5043 Acute on chronic combined systolic (congestive) and diastolic (congestive) heart failure: Secondary | ICD-10-CM | POA: Diagnosis not present

## 2024-06-16 DIAGNOSIS — I252 Old myocardial infarction: Secondary | ICD-10-CM | POA: Diagnosis not present

## 2024-06-16 DIAGNOSIS — M1990 Unspecified osteoarthritis, unspecified site: Secondary | ICD-10-CM | POA: Diagnosis not present

## 2024-06-16 DIAGNOSIS — E559 Vitamin D deficiency, unspecified: Secondary | ICD-10-CM | POA: Diagnosis not present

## 2024-06-16 DIAGNOSIS — I440 Atrioventricular block, first degree: Secondary | ICD-10-CM | POA: Diagnosis not present

## 2024-06-16 DIAGNOSIS — K31811 Angiodysplasia of stomach and duodenum with bleeding: Secondary | ICD-10-CM | POA: Diagnosis not present

## 2024-06-16 DIAGNOSIS — E1142 Type 2 diabetes mellitus with diabetic polyneuropathy: Secondary | ICD-10-CM | POA: Diagnosis not present

## 2024-06-16 DIAGNOSIS — E272 Addisonian crisis: Secondary | ICD-10-CM | POA: Diagnosis not present

## 2024-06-16 DIAGNOSIS — D6861 Antiphospholipid syndrome: Secondary | ICD-10-CM | POA: Diagnosis not present

## 2024-06-16 DIAGNOSIS — D500 Iron deficiency anemia secondary to blood loss (chronic): Secondary | ICD-10-CM | POA: Diagnosis not present

## 2024-06-16 DIAGNOSIS — I110 Hypertensive heart disease with heart failure: Secondary | ICD-10-CM | POA: Diagnosis not present

## 2024-06-16 DIAGNOSIS — A419 Sepsis, unspecified organism: Secondary | ICD-10-CM | POA: Diagnosis not present

## 2024-06-16 DIAGNOSIS — J440 Chronic obstructive pulmonary disease with acute lower respiratory infection: Secondary | ICD-10-CM | POA: Diagnosis not present

## 2024-06-16 DIAGNOSIS — I700 Atherosclerosis of aorta: Secondary | ICD-10-CM | POA: Diagnosis not present

## 2024-06-16 DIAGNOSIS — R911 Solitary pulmonary nodule: Secondary | ICD-10-CM | POA: Diagnosis not present

## 2024-06-16 DIAGNOSIS — N179 Acute kidney failure, unspecified: Secondary | ICD-10-CM | POA: Diagnosis not present

## 2024-06-17 DIAGNOSIS — D6861 Antiphospholipid syndrome: Secondary | ICD-10-CM | POA: Diagnosis not present

## 2024-06-17 DIAGNOSIS — I252 Old myocardial infarction: Secondary | ICD-10-CM | POA: Diagnosis not present

## 2024-06-17 DIAGNOSIS — M1990 Unspecified osteoarthritis, unspecified site: Secondary | ICD-10-CM | POA: Diagnosis not present

## 2024-06-17 DIAGNOSIS — J440 Chronic obstructive pulmonary disease with acute lower respiratory infection: Secondary | ICD-10-CM | POA: Diagnosis not present

## 2024-06-17 DIAGNOSIS — I959 Hypotension, unspecified: Secondary | ICD-10-CM | POA: Diagnosis not present

## 2024-06-17 DIAGNOSIS — I5043 Acute on chronic combined systolic (congestive) and diastolic (congestive) heart failure: Secondary | ICD-10-CM | POA: Diagnosis not present

## 2024-06-17 DIAGNOSIS — E272 Addisonian crisis: Secondary | ICD-10-CM | POA: Diagnosis not present

## 2024-06-17 DIAGNOSIS — I700 Atherosclerosis of aorta: Secondary | ICD-10-CM | POA: Diagnosis not present

## 2024-06-17 DIAGNOSIS — I25110 Atherosclerotic heart disease of native coronary artery with unstable angina pectoris: Secondary | ICD-10-CM | POA: Diagnosis not present

## 2024-06-17 DIAGNOSIS — N289 Disorder of kidney and ureter, unspecified: Secondary | ICD-10-CM | POA: Diagnosis not present

## 2024-06-17 DIAGNOSIS — R911 Solitary pulmonary nodule: Secondary | ICD-10-CM | POA: Diagnosis not present

## 2024-06-17 DIAGNOSIS — I110 Hypertensive heart disease with heart failure: Secondary | ICD-10-CM | POA: Diagnosis not present

## 2024-06-17 DIAGNOSIS — G4733 Obstructive sleep apnea (adult) (pediatric): Secondary | ICD-10-CM | POA: Diagnosis not present

## 2024-06-17 DIAGNOSIS — Z79899 Other long term (current) drug therapy: Secondary | ICD-10-CM | POA: Diagnosis not present

## 2024-06-17 DIAGNOSIS — I440 Atrioventricular block, first degree: Secondary | ICD-10-CM | POA: Diagnosis not present

## 2024-06-17 DIAGNOSIS — A419 Sepsis, unspecified organism: Secondary | ICD-10-CM | POA: Diagnosis not present

## 2024-06-17 DIAGNOSIS — D500 Iron deficiency anemia secondary to blood loss (chronic): Secondary | ICD-10-CM | POA: Diagnosis not present

## 2024-06-17 DIAGNOSIS — R338 Other retention of urine: Secondary | ICD-10-CM | POA: Diagnosis not present

## 2024-06-17 DIAGNOSIS — D649 Anemia, unspecified: Secondary | ICD-10-CM | POA: Diagnosis not present

## 2024-06-17 DIAGNOSIS — N179 Acute kidney failure, unspecified: Secondary | ICD-10-CM | POA: Diagnosis not present

## 2024-06-17 DIAGNOSIS — E559 Vitamin D deficiency, unspecified: Secondary | ICD-10-CM | POA: Diagnosis not present

## 2024-06-17 DIAGNOSIS — R6521 Severe sepsis with septic shock: Secondary | ICD-10-CM | POA: Diagnosis not present

## 2024-06-17 DIAGNOSIS — E1142 Type 2 diabetes mellitus with diabetic polyneuropathy: Secondary | ICD-10-CM | POA: Diagnosis not present

## 2024-06-17 DIAGNOSIS — I480 Paroxysmal atrial fibrillation: Secondary | ICD-10-CM | POA: Diagnosis not present

## 2024-06-17 DIAGNOSIS — N189 Chronic kidney disease, unspecified: Secondary | ICD-10-CM | POA: Diagnosis not present

## 2024-06-17 DIAGNOSIS — K31811 Angiodysplasia of stomach and duodenum with bleeding: Secondary | ICD-10-CM | POA: Diagnosis not present

## 2024-06-17 DIAGNOSIS — J439 Emphysema, unspecified: Secondary | ICD-10-CM | POA: Diagnosis not present

## 2024-06-21 DIAGNOSIS — Z0000 Encounter for general adult medical examination without abnormal findings: Secondary | ICD-10-CM | POA: Diagnosis not present

## 2024-06-21 DIAGNOSIS — Z9181 History of falling: Secondary | ICD-10-CM | POA: Diagnosis not present

## 2024-06-23 DIAGNOSIS — D649 Anemia, unspecified: Secondary | ICD-10-CM | POA: Diagnosis not present

## 2024-06-23 DIAGNOSIS — E1142 Type 2 diabetes mellitus with diabetic polyneuropathy: Secondary | ICD-10-CM | POA: Diagnosis not present

## 2024-06-23 DIAGNOSIS — J440 Chronic obstructive pulmonary disease with acute lower respiratory infection: Secondary | ICD-10-CM | POA: Diagnosis not present

## 2024-06-23 DIAGNOSIS — E272 Addisonian crisis: Secondary | ICD-10-CM | POA: Diagnosis not present

## 2024-06-23 DIAGNOSIS — G4733 Obstructive sleep apnea (adult) (pediatric): Secondary | ICD-10-CM | POA: Diagnosis not present

## 2024-06-23 DIAGNOSIS — R6521 Severe sepsis with septic shock: Secondary | ICD-10-CM | POA: Diagnosis not present

## 2024-06-23 DIAGNOSIS — N179 Acute kidney failure, unspecified: Secondary | ICD-10-CM | POA: Diagnosis not present

## 2024-06-23 DIAGNOSIS — D500 Iron deficiency anemia secondary to blood loss (chronic): Secondary | ICD-10-CM | POA: Diagnosis not present

## 2024-06-23 DIAGNOSIS — R338 Other retention of urine: Secondary | ICD-10-CM | POA: Diagnosis not present

## 2024-06-23 DIAGNOSIS — K31811 Angiodysplasia of stomach and duodenum with bleeding: Secondary | ICD-10-CM | POA: Diagnosis not present

## 2024-06-23 DIAGNOSIS — E559 Vitamin D deficiency, unspecified: Secondary | ICD-10-CM | POA: Diagnosis not present

## 2024-06-23 DIAGNOSIS — I700 Atherosclerosis of aorta: Secondary | ICD-10-CM | POA: Diagnosis not present

## 2024-06-23 DIAGNOSIS — I252 Old myocardial infarction: Secondary | ICD-10-CM | POA: Diagnosis not present

## 2024-06-23 DIAGNOSIS — D6861 Antiphospholipid syndrome: Secondary | ICD-10-CM | POA: Diagnosis not present

## 2024-06-23 DIAGNOSIS — I25110 Atherosclerotic heart disease of native coronary artery with unstable angina pectoris: Secondary | ICD-10-CM | POA: Diagnosis not present

## 2024-06-23 DIAGNOSIS — J439 Emphysema, unspecified: Secondary | ICD-10-CM | POA: Diagnosis not present

## 2024-06-23 DIAGNOSIS — R911 Solitary pulmonary nodule: Secondary | ICD-10-CM | POA: Diagnosis not present

## 2024-06-23 DIAGNOSIS — I440 Atrioventricular block, first degree: Secondary | ICD-10-CM | POA: Diagnosis not present

## 2024-06-23 DIAGNOSIS — I5043 Acute on chronic combined systolic (congestive) and diastolic (congestive) heart failure: Secondary | ICD-10-CM | POA: Diagnosis not present

## 2024-06-23 DIAGNOSIS — I110 Hypertensive heart disease with heart failure: Secondary | ICD-10-CM | POA: Diagnosis not present

## 2024-06-23 DIAGNOSIS — M1990 Unspecified osteoarthritis, unspecified site: Secondary | ICD-10-CM | POA: Diagnosis not present

## 2024-06-23 DIAGNOSIS — A419 Sepsis, unspecified organism: Secondary | ICD-10-CM | POA: Diagnosis not present

## 2024-06-24 DIAGNOSIS — J479 Bronchiectasis, uncomplicated: Secondary | ICD-10-CM | POA: Diagnosis not present

## 2024-06-24 DIAGNOSIS — R918 Other nonspecific abnormal finding of lung field: Secondary | ICD-10-CM | POA: Diagnosis not present

## 2024-06-24 DIAGNOSIS — J449 Chronic obstructive pulmonary disease, unspecified: Secondary | ICD-10-CM | POA: Diagnosis not present

## 2024-06-24 DIAGNOSIS — F17210 Nicotine dependence, cigarettes, uncomplicated: Secondary | ICD-10-CM | POA: Diagnosis not present

## 2024-06-24 DIAGNOSIS — R933 Abnormal findings on diagnostic imaging of other parts of digestive tract: Secondary | ICD-10-CM | POA: Diagnosis not present

## 2024-06-24 DIAGNOSIS — G4733 Obstructive sleep apnea (adult) (pediatric): Secondary | ICD-10-CM | POA: Diagnosis not present

## 2024-06-26 DIAGNOSIS — R933 Abnormal findings on diagnostic imaging of other parts of digestive tract: Secondary | ICD-10-CM | POA: Diagnosis not present

## 2024-06-28 DIAGNOSIS — R6521 Severe sepsis with septic shock: Secondary | ICD-10-CM | POA: Diagnosis not present

## 2024-06-28 DIAGNOSIS — I110 Hypertensive heart disease with heart failure: Secondary | ICD-10-CM | POA: Diagnosis not present

## 2024-06-28 DIAGNOSIS — K31811 Angiodysplasia of stomach and duodenum with bleeding: Secondary | ICD-10-CM | POA: Diagnosis not present

## 2024-06-28 DIAGNOSIS — E1142 Type 2 diabetes mellitus with diabetic polyneuropathy: Secondary | ICD-10-CM | POA: Diagnosis not present

## 2024-06-28 DIAGNOSIS — I252 Old myocardial infarction: Secondary | ICD-10-CM | POA: Diagnosis not present

## 2024-06-28 DIAGNOSIS — J440 Chronic obstructive pulmonary disease with acute lower respiratory infection: Secondary | ICD-10-CM | POA: Diagnosis not present

## 2024-06-28 DIAGNOSIS — I440 Atrioventricular block, first degree: Secondary | ICD-10-CM | POA: Diagnosis not present

## 2024-06-28 DIAGNOSIS — D6861 Antiphospholipid syndrome: Secondary | ICD-10-CM | POA: Diagnosis not present

## 2024-06-28 DIAGNOSIS — G4733 Obstructive sleep apnea (adult) (pediatric): Secondary | ICD-10-CM | POA: Diagnosis not present

## 2024-06-28 DIAGNOSIS — E272 Addisonian crisis: Secondary | ICD-10-CM | POA: Diagnosis not present

## 2024-06-28 DIAGNOSIS — A419 Sepsis, unspecified organism: Secondary | ICD-10-CM | POA: Diagnosis not present

## 2024-06-28 DIAGNOSIS — N179 Acute kidney failure, unspecified: Secondary | ICD-10-CM | POA: Diagnosis not present

## 2024-06-28 DIAGNOSIS — R911 Solitary pulmonary nodule: Secondary | ICD-10-CM | POA: Diagnosis not present

## 2024-06-28 DIAGNOSIS — I700 Atherosclerosis of aorta: Secondary | ICD-10-CM | POA: Diagnosis not present

## 2024-06-28 DIAGNOSIS — J439 Emphysema, unspecified: Secondary | ICD-10-CM | POA: Diagnosis not present

## 2024-06-28 DIAGNOSIS — R338 Other retention of urine: Secondary | ICD-10-CM | POA: Diagnosis not present

## 2024-06-28 DIAGNOSIS — I5043 Acute on chronic combined systolic (congestive) and diastolic (congestive) heart failure: Secondary | ICD-10-CM | POA: Diagnosis not present

## 2024-06-28 DIAGNOSIS — D500 Iron deficiency anemia secondary to blood loss (chronic): Secondary | ICD-10-CM | POA: Diagnosis not present

## 2024-06-28 DIAGNOSIS — I25110 Atherosclerotic heart disease of native coronary artery with unstable angina pectoris: Secondary | ICD-10-CM | POA: Diagnosis not present

## 2024-06-28 DIAGNOSIS — E559 Vitamin D deficiency, unspecified: Secondary | ICD-10-CM | POA: Diagnosis not present

## 2024-06-28 DIAGNOSIS — I480 Paroxysmal atrial fibrillation: Secondary | ICD-10-CM | POA: Diagnosis not present

## 2024-06-28 DIAGNOSIS — M1990 Unspecified osteoarthritis, unspecified site: Secondary | ICD-10-CM | POA: Diagnosis not present

## 2024-06-29 DIAGNOSIS — R918 Other nonspecific abnormal finding of lung field: Secondary | ICD-10-CM | POA: Diagnosis not present

## 2024-06-29 DIAGNOSIS — J479 Bronchiectasis, uncomplicated: Secondary | ICD-10-CM | POA: Diagnosis not present

## 2024-06-30 ENCOUNTER — Other Ambulatory Visit: Payer: Self-pay | Admitting: Cardiology

## 2024-06-30 DIAGNOSIS — D6861 Antiphospholipid syndrome: Secondary | ICD-10-CM | POA: Diagnosis not present

## 2024-06-30 DIAGNOSIS — J439 Emphysema, unspecified: Secondary | ICD-10-CM | POA: Diagnosis not present

## 2024-06-30 DIAGNOSIS — M1990 Unspecified osteoarthritis, unspecified site: Secondary | ICD-10-CM | POA: Diagnosis not present

## 2024-06-30 DIAGNOSIS — I440 Atrioventricular block, first degree: Secondary | ICD-10-CM | POA: Diagnosis not present

## 2024-06-30 DIAGNOSIS — I5043 Acute on chronic combined systolic (congestive) and diastolic (congestive) heart failure: Secondary | ICD-10-CM | POA: Diagnosis not present

## 2024-06-30 DIAGNOSIS — N179 Acute kidney failure, unspecified: Secondary | ICD-10-CM | POA: Diagnosis not present

## 2024-06-30 DIAGNOSIS — R911 Solitary pulmonary nodule: Secondary | ICD-10-CM | POA: Diagnosis not present

## 2024-06-30 DIAGNOSIS — I110 Hypertensive heart disease with heart failure: Secondary | ICD-10-CM | POA: Diagnosis not present

## 2024-06-30 DIAGNOSIS — R338 Other retention of urine: Secondary | ICD-10-CM | POA: Diagnosis not present

## 2024-06-30 DIAGNOSIS — D500 Iron deficiency anemia secondary to blood loss (chronic): Secondary | ICD-10-CM | POA: Diagnosis not present

## 2024-06-30 DIAGNOSIS — R6521 Severe sepsis with septic shock: Secondary | ICD-10-CM | POA: Diagnosis not present

## 2024-06-30 DIAGNOSIS — I700 Atherosclerosis of aorta: Secondary | ICD-10-CM | POA: Diagnosis not present

## 2024-06-30 DIAGNOSIS — E559 Vitamin D deficiency, unspecified: Secondary | ICD-10-CM | POA: Diagnosis not present

## 2024-06-30 DIAGNOSIS — A419 Sepsis, unspecified organism: Secondary | ICD-10-CM | POA: Diagnosis not present

## 2024-06-30 DIAGNOSIS — E272 Addisonian crisis: Secondary | ICD-10-CM | POA: Diagnosis not present

## 2024-06-30 DIAGNOSIS — I480 Paroxysmal atrial fibrillation: Secondary | ICD-10-CM | POA: Diagnosis not present

## 2024-06-30 DIAGNOSIS — J440 Chronic obstructive pulmonary disease with acute lower respiratory infection: Secondary | ICD-10-CM | POA: Diagnosis not present

## 2024-06-30 DIAGNOSIS — I252 Old myocardial infarction: Secondary | ICD-10-CM | POA: Diagnosis not present

## 2024-06-30 DIAGNOSIS — I25110 Atherosclerotic heart disease of native coronary artery with unstable angina pectoris: Secondary | ICD-10-CM | POA: Diagnosis not present

## 2024-06-30 DIAGNOSIS — K31811 Angiodysplasia of stomach and duodenum with bleeding: Secondary | ICD-10-CM | POA: Diagnosis not present

## 2024-06-30 DIAGNOSIS — E1142 Type 2 diabetes mellitus with diabetic polyneuropathy: Secondary | ICD-10-CM | POA: Diagnosis not present

## 2024-07-04 DIAGNOSIS — E1142 Type 2 diabetes mellitus with diabetic polyneuropathy: Secondary | ICD-10-CM | POA: Diagnosis not present

## 2024-07-04 DIAGNOSIS — D500 Iron deficiency anemia secondary to blood loss (chronic): Secondary | ICD-10-CM | POA: Diagnosis not present

## 2024-07-04 DIAGNOSIS — E559 Vitamin D deficiency, unspecified: Secondary | ICD-10-CM | POA: Diagnosis not present

## 2024-07-04 DIAGNOSIS — J439 Emphysema, unspecified: Secondary | ICD-10-CM | POA: Diagnosis not present

## 2024-07-04 DIAGNOSIS — G4733 Obstructive sleep apnea (adult) (pediatric): Secondary | ICD-10-CM | POA: Diagnosis not present

## 2024-07-04 DIAGNOSIS — I5043 Acute on chronic combined systolic (congestive) and diastolic (congestive) heart failure: Secondary | ICD-10-CM | POA: Diagnosis not present

## 2024-07-04 DIAGNOSIS — I440 Atrioventricular block, first degree: Secondary | ICD-10-CM | POA: Diagnosis not present

## 2024-07-04 DIAGNOSIS — R6521 Severe sepsis with septic shock: Secondary | ICD-10-CM | POA: Diagnosis not present

## 2024-07-04 DIAGNOSIS — I480 Paroxysmal atrial fibrillation: Secondary | ICD-10-CM | POA: Diagnosis not present

## 2024-07-04 DIAGNOSIS — I700 Atherosclerosis of aorta: Secondary | ICD-10-CM | POA: Diagnosis not present

## 2024-07-04 DIAGNOSIS — M1990 Unspecified osteoarthritis, unspecified site: Secondary | ICD-10-CM | POA: Diagnosis not present

## 2024-07-04 DIAGNOSIS — I25110 Atherosclerotic heart disease of native coronary artery with unstable angina pectoris: Secondary | ICD-10-CM | POA: Diagnosis not present

## 2024-07-04 DIAGNOSIS — I252 Old myocardial infarction: Secondary | ICD-10-CM | POA: Diagnosis not present

## 2024-07-04 DIAGNOSIS — J440 Chronic obstructive pulmonary disease with acute lower respiratory infection: Secondary | ICD-10-CM | POA: Diagnosis not present

## 2024-07-04 DIAGNOSIS — R911 Solitary pulmonary nodule: Secondary | ICD-10-CM | POA: Diagnosis not present

## 2024-07-04 DIAGNOSIS — K31811 Angiodysplasia of stomach and duodenum with bleeding: Secondary | ICD-10-CM | POA: Diagnosis not present

## 2024-07-04 DIAGNOSIS — A419 Sepsis, unspecified organism: Secondary | ICD-10-CM | POA: Diagnosis not present

## 2024-07-04 DIAGNOSIS — R338 Other retention of urine: Secondary | ICD-10-CM | POA: Diagnosis not present

## 2024-07-04 DIAGNOSIS — R339 Retention of urine, unspecified: Secondary | ICD-10-CM | POA: Diagnosis not present

## 2024-07-04 DIAGNOSIS — D6861 Antiphospholipid syndrome: Secondary | ICD-10-CM | POA: Diagnosis not present

## 2024-07-04 DIAGNOSIS — N179 Acute kidney failure, unspecified: Secondary | ICD-10-CM | POA: Diagnosis not present

## 2024-07-04 DIAGNOSIS — E272 Addisonian crisis: Secondary | ICD-10-CM | POA: Diagnosis not present

## 2024-07-04 DIAGNOSIS — I110 Hypertensive heart disease with heart failure: Secondary | ICD-10-CM | POA: Diagnosis not present

## 2024-07-05 DIAGNOSIS — J439 Emphysema, unspecified: Secondary | ICD-10-CM | POA: Diagnosis not present

## 2024-07-12 DIAGNOSIS — I25110 Atherosclerotic heart disease of native coronary artery with unstable angina pectoris: Secondary | ICD-10-CM | POA: Diagnosis not present

## 2024-07-12 DIAGNOSIS — R6521 Severe sepsis with septic shock: Secondary | ICD-10-CM | POA: Diagnosis not present

## 2024-07-12 DIAGNOSIS — I110 Hypertensive heart disease with heart failure: Secondary | ICD-10-CM | POA: Diagnosis not present

## 2024-07-12 DIAGNOSIS — J439 Emphysema, unspecified: Secondary | ICD-10-CM | POA: Diagnosis not present

## 2024-07-12 DIAGNOSIS — D500 Iron deficiency anemia secondary to blood loss (chronic): Secondary | ICD-10-CM | POA: Diagnosis not present

## 2024-07-12 DIAGNOSIS — E1142 Type 2 diabetes mellitus with diabetic polyneuropathy: Secondary | ICD-10-CM | POA: Diagnosis not present

## 2024-07-12 DIAGNOSIS — I5043 Acute on chronic combined systolic (congestive) and diastolic (congestive) heart failure: Secondary | ICD-10-CM | POA: Diagnosis not present

## 2024-07-12 DIAGNOSIS — M1990 Unspecified osteoarthritis, unspecified site: Secondary | ICD-10-CM | POA: Diagnosis not present

## 2024-07-12 DIAGNOSIS — E272 Addisonian crisis: Secondary | ICD-10-CM | POA: Diagnosis not present

## 2024-07-12 DIAGNOSIS — E559 Vitamin D deficiency, unspecified: Secondary | ICD-10-CM | POA: Diagnosis not present

## 2024-07-12 DIAGNOSIS — I480 Paroxysmal atrial fibrillation: Secondary | ICD-10-CM | POA: Diagnosis not present

## 2024-07-12 DIAGNOSIS — D6861 Antiphospholipid syndrome: Secondary | ICD-10-CM | POA: Diagnosis not present

## 2024-07-12 DIAGNOSIS — I440 Atrioventricular block, first degree: Secondary | ICD-10-CM | POA: Diagnosis not present

## 2024-07-12 DIAGNOSIS — I252 Old myocardial infarction: Secondary | ICD-10-CM | POA: Diagnosis not present

## 2024-07-12 DIAGNOSIS — I700 Atherosclerosis of aorta: Secondary | ICD-10-CM | POA: Diagnosis not present

## 2024-07-12 DIAGNOSIS — N179 Acute kidney failure, unspecified: Secondary | ICD-10-CM | POA: Diagnosis not present

## 2024-07-12 DIAGNOSIS — J440 Chronic obstructive pulmonary disease with acute lower respiratory infection: Secondary | ICD-10-CM | POA: Diagnosis not present

## 2024-07-12 DIAGNOSIS — R338 Other retention of urine: Secondary | ICD-10-CM | POA: Diagnosis not present

## 2024-07-12 DIAGNOSIS — D649 Anemia, unspecified: Secondary | ICD-10-CM | POA: Diagnosis not present

## 2024-07-12 DIAGNOSIS — A419 Sepsis, unspecified organism: Secondary | ICD-10-CM | POA: Diagnosis not present

## 2024-07-12 DIAGNOSIS — K31811 Angiodysplasia of stomach and duodenum with bleeding: Secondary | ICD-10-CM | POA: Diagnosis not present

## 2024-07-12 DIAGNOSIS — G4733 Obstructive sleep apnea (adult) (pediatric): Secondary | ICD-10-CM | POA: Diagnosis not present

## 2024-07-12 DIAGNOSIS — R911 Solitary pulmonary nodule: Secondary | ICD-10-CM | POA: Diagnosis not present

## 2024-07-19 DIAGNOSIS — D649 Anemia, unspecified: Secondary | ICD-10-CM | POA: Diagnosis not present

## 2024-07-19 DIAGNOSIS — N1831 Chronic kidney disease, stage 3a: Secondary | ICD-10-CM | POA: Diagnosis not present

## 2024-07-19 DIAGNOSIS — Z23 Encounter for immunization: Secondary | ICD-10-CM | POA: Diagnosis not present

## 2024-07-24 ENCOUNTER — Other Ambulatory Visit: Payer: Self-pay | Admitting: Cardiology

## 2024-08-01 DIAGNOSIS — K31819 Angiodysplasia of stomach and duodenum without bleeding: Secondary | ICD-10-CM | POA: Diagnosis not present

## 2024-08-04 DIAGNOSIS — D509 Iron deficiency anemia, unspecified: Secondary | ICD-10-CM | POA: Diagnosis not present

## 2024-08-23 ENCOUNTER — Encounter: Payer: Self-pay | Admitting: *Deleted

## 2024-08-24 ENCOUNTER — Ambulatory Visit: Admitting: Cardiology

## 2024-09-13 NOTE — Progress Notes (Signed)
 Matthew Adams                                          MRN: [id number]   09/13/2024   The VBCI Quality Team Specialist reviewed this patient medical record for the purposes of chart review for care gap closure. The following were reviewed: chart review for care gap closure-kidney health evaluation for diabetes:eGFR  and uACR.    VBCI Quality Team

## 2024-09-13 NOTE — Progress Notes (Signed)
 Matthew Adams                                          MRN: [id number]   09/13/2024   The VBCI Quality Team Specialist reviewed this patient medical record for the purposes of chart review for care gap closure. The following were reviewed: abstraction for care gap closure-glycemic status assessment.    VBCI Quality Team

## 2024-10-03 ENCOUNTER — Encounter: Payer: Self-pay | Admitting: Cardiology

## 2024-10-03 ENCOUNTER — Ambulatory Visit: Attending: Cardiology | Admitting: Cardiology

## 2024-10-03 VITALS — BP 90/70 | HR 90 | Ht 69.0 in | Wt 188.0 lb

## 2024-10-03 DIAGNOSIS — Z951 Presence of aortocoronary bypass graft: Secondary | ICD-10-CM | POA: Diagnosis not present

## 2024-10-03 DIAGNOSIS — I2510 Atherosclerotic heart disease of native coronary artery without angina pectoris: Secondary | ICD-10-CM | POA: Diagnosis not present

## 2024-10-03 DIAGNOSIS — Z8639 Personal history of other endocrine, nutritional and metabolic disease: Secondary | ICD-10-CM | POA: Diagnosis not present

## 2024-10-03 DIAGNOSIS — E893 Postprocedural hypopituitarism: Secondary | ICD-10-CM

## 2024-10-03 DIAGNOSIS — I252 Old myocardial infarction: Secondary | ICD-10-CM | POA: Diagnosis not present

## 2024-10-03 NOTE — Patient Instructions (Signed)
 Medication Instructions:  Your physician recommends that you continue on your current medications as directed. Please refer to the Current Medication list given to you today.  *If you need a refill on your cardiac medications before your next appointment, please call your pharmacy*   Lab Work: None ordered If you have labs (blood work) drawn today and your tests are completely normal, you will receive your results only by: MyChart Message (if you have MyChart) OR A paper copy in the mail If you have any lab test that is abnormal or we need to change your treatment, we will call you to review the results.  Testing/Procedures: Your physician has requested that you have an echocardiogram. Echocardiography is a painless test that uses sound waves to create images of your heart. It provides your doctor with information about the size and shape of your heart and how well your heart's chambers and valves are working. This procedure takes approximately one hour. There are no restrictions for this procedure. Please do NOT wear cologne, perfume, aftershave, or lotions (deodorant is allowed). Please arrive 15 minutes prior to your appointment time.  Please note: We ask at that you not bring children with you during ultrasound (echo/ vascular) testing. Due to room size and safety concerns, children are not allowed in the ultrasound rooms during exams. Our front office staff cannot provide observation of children in our lobby area while testing is being conducted. An adult accompanying a patient to their appointment will only be allowed in the ultrasound room at the discretion of the ultrasound technician under special circumstances. We apologize for any inconvenience.  Follow-Up: At [facility], you and your health needs are our priority.  As part of our continuing mission to provide you with exceptional heart care, we have created designated Provider Care Teams.  These Care Teams include your primary  Cardiologist (physician) and Advanced Practice Providers (APPs -  Physician Assistants and Nurse Practitioners) who all work together to provide you with the care you need, when you need it.  We recommend signing up for the patient portal called MyChart.  Sign up information is provided on this After Visit Summary.  MyChart is used to connect with patients for Virtual Visits (Telemedicine).  Patients are able to view lab/test results, encounter notes, upcoming appointments, etc.  Non-urgent messages can be sent to your provider as well.   To learn more about what you can do with MyChart, go to [email or web address].    Your next appointment:   6 month(s)  The format for your next appointment:   In Person  Provider:   Lamar Fitch, MD   Other Instructions Echocardiogram An echocardiogram is a test that uses sound waves (ultrasound) to produce images of the heart. Images from an echocardiogram can provide important information about: Heart size and shape. The size and thickness and movement of your heart's walls. Heart muscle function and strength. Heart valve function or if you have stenosis. Stenosis is when the heart valves are too narrow. If blood is flowing backward through the heart valves (regurgitation). A tumor or infectious growth around the heart valves. Areas of heart muscle that are not working well because of poor blood flow or injury from a heart attack. Aneurysm detection. An aneurysm is a weak or damaged part of an artery wall. The wall bulges out from the normal force of blood pumping through the body. Tell a health care provider about: Any allergies you have. All medicines you are taking, including vitamins, herbs,  eye drops, creams, and over-the-counter medicines. Any blood disorders you have. Any surgeries you have had. Any medical conditions you have. Whether you are pregnant or may be pregnant. What are the risks? Generally, this is a safe test.  However, problems may occur, including an allergic reaction to dye (contrast) that may be used during the test. What happens before the test? No specific preparation is needed. You may eat and drink normally. What happens during the test? You will take off your clothes from the waist up and put on a hospital gown. Electrodes or electrocardiogram (ECG)patches may be placed on your chest. The electrodes or patches are then connected to a device that monitors your heart rate and rhythm. You will lie down on a table for an ultrasound exam. A gel will be applied to your chest to help sound waves pass through your skin. A handheld device, called a transducer, will be pressed against your chest and moved over your heart. The transducer produces sound waves that travel to your heart and bounce back (or echo back) to the transducer. These sound waves will be captured in real-time and changed into images of your heart that can be viewed on a video monitor. The images will be recorded on a computer and reviewed by your health care provider. You may be asked to change positions or hold your breath for a short time. This makes it easier to get different views or better views of your heart. In some cases, you may receive contrast through an IV in one of your veins. This can improve the quality of the pictures from your heart. The procedure may vary among health care providers and hospitals.   What can I expect after the test? You may return to your normal, everyday life, including diet, activities, and medicines, unless your health care provider tells you not to do that. Follow these instructions at home: It is up to you to get the results of your test. Ask your health care provider, or the department that is doing the test, when your results will be ready. Keep all follow-up visits. This is important. Summary An echocardiogram is a test that uses sound waves (ultrasound) to produce images of the heart. Images  from an echocardiogram can provide important information about the size and shape of your heart, heart muscle function, heart valve function, and other possible heart problems. You do not need to do anything to prepare before this test. You may eat and drink normally. After the echocardiogram is completed, you may return to your normal, everyday life, unless your health care provider tells you not to do that. This information is not intended to replace advice given to you by your health care provider. Make sure you discuss any questions you have with your health care provider. Document Revised: 05/22/2020 Document Reviewed: 05/22/2020 Elsevier Patient Education  2021 Elsevier Inc.   Important Information About Sugar

## 2024-10-03 NOTE — Addendum Note (Signed)
 Addended by: ONEITA BERLINER on: 10/03/2024 03:14 PM   Modules accepted: Orders

## 2024-10-03 NOTE — Progress Notes (Signed)
 " Cardiology Office Note:    Date:  10/03/2024   ID:  Matthew Adams, DOB 1961/06/12, MRN [id number]  PCP:  Vicci Odor, PA  Cardiologist:  Lamar Fitch, MD    Referring MD: Vicci Odor, [place]   Chief Complaint  Patient presents with   Follow-up  Doing fine now  History of Present Illness:    Matthew Adams is a 63 y.o. male past medical history significant for coronary artery disease in 2014 he got coronary bypass graft x 4 with sequential SVG to G2 obtuse marginal branch 1 PDA, SVG to LAD, for LIMA to diagonal 1's Y graft off SVG, in September 2022 he got another cardiac catheterization done which showed completely occluded through the IMA as well as SVG to LAD.  Additional problem clued COPD, history of smoking, history of pulmonary emboli, he is on long-term anticoagulation still continue, dyslipidemia, acromegaly status post adenoma resection.  Comes today to months for follow-up in the summer he ended being in the hospital severe anemia, apparently some GI bleeding source has been identified in the lower stomach and duodenum.  Since that time he is doing fine.  Denies having chest pain tightness squeezing pressure mid chest.  He was also scheduled to have a shoulder surgery but because of GI bleed that did not happen  Past Medical History:  Diagnosis Date   Abnormal ECG 08/09/2020   Abnormal nuclear stress test 12/25/2017   Acute diffuse otitis externa of left ear 01/08/2023   Angina pectoris 08/04/2022   Antiphospholipid antibody syndrome 08/04/2022   Formatting of this note might be different from the original. On chronic anticoagulation. Last PE 2011   Arthritis    hips   Atherosclerotic heart disease of native coronary artery without angina pectoris 05/18/2013   Formatting of this note might be different from the original. 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 COPD, FEV1 is 54% predicted and DLCO is 70% predicted.   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 s/p ATB   History of pulmonary embolism 12/30/2011   Hx of CABG 06/04/2015   Formatting of this note might be different from the original. 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 03/24/13 T4 = 1.7 (0.82-1.77)   Hypothyroidism    Long term current use of anticoagulant therapy 06/17/2017   Myocardial infarction [facility])    pt denies   Old MI (myocardial infarction)  07/05/2021   Pain of right hip joint 07/27/2020   Pneumonia 07/2020   Preop cardiovascular exam 08/13/2020   Pulmonary cavitary lesion 12/30/2011   Pulmonary embolism (HCC)    Secondary adrenal insufficiency 08/05/2017   Secondary hypothyroidism 01/05/2016   Severe chronic obstructive pulmonary disease (HCC) 12/30/2011   IMO SNOMED Dx Update Oct 2024     Sleep apnea    Smoking 08/05/2022   Type 2 diabetes mellitus without complication 06/04/2015    Past Surgical History:  Procedure Laterality Date    APPENDECTOMY     CARDIAC CATHETERIZATION  2019   1 stent   CORONARY ARTERY BYPASS GRAFT  2014   TOTAL HIP ARTHROPLASTY Right 09/19/2020   Procedure: TOTAL HIP ARTHROPLASTY ANTERIOR APPROACH;  Surgeon: Fidel Rogue, MD;  Location: [facility];  Service: Orthopedics;  Laterality: Right;   TRANSPHENOIDAL PITUITARY RESECTION  2003   recurrance treated with gamma knife    Current Medications: Active Medications[1]   Allergies:   Patient has no known allergies.   Social History   Socioeconomic History   Marital status: Divorced    Spouse name: Not on file   Number of children: Not on file   Years of education: Not on file   Highest education level: Not on file  Occupational History   Not on file  Tobacco Use   Smoking status: Every Day    Current packs/day: 0.00    Average packs/day: 0.3 packs/day for 25.0 years (6.3 ttl pk-yrs)    Types: Cigarettes    Start date: 11/09/1986    Last attempt to quit: 11/10/2011    Years since quitting: 12.9   Smokeless tobacco: Never  Vaping Use   Vaping status: Never Used  Substance and Sexual Activity   Alcohol  use: No   Drug use: No   Sexual activity: Not on file  Other Topics Concern   Not on file  Social History Narrative   Not on file   Social Drivers of Health   Tobacco Use: High Risk (10/03/2024)   Patient History    Smoking Tobacco Use: Every Day    Smokeless Tobacco Use: Never    Passive Exposure: Not on file  Financial Resource Strain: Not on file  Food Insecurity: Not on file  Transportation Needs: Not on file  Physical Activity: Not on file  Stress: Not on file  Social Connections: Not on file  Depression (EYV7-0): Not on file  Alcohol  Screen: Not on file  Housing: Not on file  Utilities: Not on file  Health Literacy: Not on file     Family History: The patient's family history includes Heart disease in his father. ROS:   Please see the history of present illness.    All 14 point review of systems negative except  as described per history of present illness  EKGs/Labs/Other Studies Reviewed:         Recent Labs: No results found for requested labs within last 365 days.  Recent Lipid Panel    Component Value Date/Time   CHOL 154 09/21/2023 0839   TRIG 116 09/21/2023 0839   HDL 46 09/21/2023 0839   CHOLHDL 3.3 09/21/2023 0839   LDLCALC 87 09/21/2023 0839    Physical Exam:    VS:  BP 90/70   Pulse 90   Ht 5' 9 (1.753 m)   Wt 188 lb (85.3 kg)   SpO2 96%   BMI 27.76 kg/m     Wt Readings from Last 3 Encounters:  10/03/24 188  lb (85.3 kg)  03/01/24 186 lb (84.4 kg)  02/08/24 186 lb (84.4 kg)     GEN:  Well nourished, well developed in no acute distress HEENT: Normal NECK: No JVD; No carotid bruits LYMPHATICS: No lymphadenopathy CARDIAC: RRR, no murmurs, no rubs, no gallops RESPIRATORY:  Clear to auscultation without rales, wheezing or rhonchi  ABDOMEN: Soft, non-tender, non-distended MUSCULOSKELETAL:  No edema; No deformity  SKIN: Warm and dry LOWER EXTREMITIES: no swelling NEUROLOGIC:  Alert and oriented x 3 PSYCHIATRIC:  Normal affect   ASSESSMENT:    1. Old MI (myocardial infarction)   2. Coronary artery disease involving native coronary artery of native heart without angina pectoris   3. 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 History of acromegaly   5. Hx of CABG    PLAN:    In order of problems listed above:  Coronary disease/old myocardial infarction, seems to be doing fine from the procedure, still complain of weakness tiredness fatigue, we will schedule him to have echocardiogram done.  I did review her record from hospital no echocardiogram done while he was there even though he complained of being short of breath and having swollen legs. History of coronary bypass graft.  No signs and symptoms including UTH reactivation of the problem. History of pulmonary emboli, continue long-term anticoagulation now his H&H is stable I did review [facility] which show me last  hemoglobin from October which was 12.5.  Continue present management   Open hospital review for this visit   Medication Adjustments/Labs and Tests Ordered: Current medicines are reviewed at length with the patient today.  Concerns regarding medicines are outlined above.  No orders of the defined types were placed in this encounter.  Medication changes: No orders of the defined types were placed in this encounter.   Signed, Lamar DOROTHA Fitch, MD, [facility] 10/03/2024 3:06 PM    [facility]    [1]  Current Meds  Medication Sig   acetaminophen  (TYLENOL ) 325 MG tablet Take 650 mg by mouth every 6 (six) hours as needed for mild pain (pain score 1-3) or moderate pain (pain score 4-6).   albuterol  (PROVENTIL ) (2.5 MG/3ML) 0.083% nebulizer solution Take 2.5 mg by nebulization every 6 (six) hours as needed for wheezing or shortness of breath.   albuterol  (VENTOLIN  HFA) 108 (90 Base) MCG/ACT inhaler Inhale 1-2 puffs into the lungs every 6 (six) hours as needed for wheezing or shortness of breath.   atorvastatin  (LIPITOR) 80 MG tablet Take 1 tablet (80 mg total) by mouth daily.   Bempedoic Acid-Ezetimibe  (NEXLIZET ) 180-10 MG TABS Take 1 tablet by mouth daily.   cetirizine (ZYRTEC) 10 MG tablet Take 10 mg by mouth daily.   clopidogrel  (PLAVIX ) 75 MG tablet Take 1 tablet (75 mg total) by mouth daily.   docusate sodium  (COLACE) 100 MG capsule Take 1 capsule (100 mg total) by mouth 2 (two) times daily.   fenofibrate (TRICOR) 145 MG tablet Take 145 mg by mouth daily.   fluticasone (FLONASE ALLERGY RELIEF) 50 MCG/ACT nasal spray Place 1 spray into both nostrils daily as needed for allergies.   Fluticasone-Salmeterol (ADVAIR) 500-50 MCG/DOSE AEPB Inhale 1 puff into the lungs 2 (two) times daily.   gabapentin  (NEURONTIN ) 300 MG capsule Take 300 mg by mouth at bedtime.   hydrocortisone  (CORTEF ) 10 MG tablet Take 5-10 mg by mouth See admin instructions. Take 10 mg by mouth in the  morning and 5 mg in the evening   levothyroxine  (SYNTHROID ) 100  MCG tablet Take 100 mcg by mouth daily before breakfast.   metoprolol  tartrate (LOPRESSOR ) 25 MG tablet Take 25 mg by mouth daily.   nitroGLYCERIN  (NITROSTAT ) 0.4 MG SL tablet Place 1 tablet (0.4 mg total) under the tongue every 5 (five) minutes x 3 doses as needed for chest pain.   pantoprazole  (PROTONIX ) 40 MG tablet Take 40 mg by mouth at bedtime.   polyethylene glycol (MIRALAX  / GLYCOLAX ) 17 g packet Take 1 packet by mouth daily.   ranolazine  (RANEXA ) 1000 MG SR tablet TAKE ONE TABLET BY MOUTH TWICE DAILY   SPIRIVA  RESPIMAT 2.5 MCG/ACT AERS Inhale 1 puff into the lungs in the morning and at bedtime.   tamsulosin  (FLOMAX ) 0.4 MG CAPS capsule Take 0.4 mg by mouth at bedtime.    Testosterone  20.25 MG/ACT (1.62%) GEL Apply 3 Pump topically at bedtime. AFTER SHOWERING   torsemide (DEMADEX) 20 MG tablet Take 20 mg by mouth daily.   venlafaxine XR (EFFEXOR-XR) 37.5 MG 24 hr capsule Take 37.5 mg by mouth at bedtime.   XARELTO  15 MG TABS tablet Take 15 mg by mouth daily after supper.   "

## 2024-10-18 ENCOUNTER — Other Ambulatory Visit: Payer: Self-pay | Admitting: Cardiology

## 2024-10-24 ENCOUNTER — Telehealth: Payer: Self-pay | Admitting: Pharmacy Technician

## 2024-10-24 ENCOUNTER — Other Ambulatory Visit (HOSPITAL_COMMUNITY): Payer: Self-pay

## 2024-10-24 NOTE — Telephone Encounter (Signed)
 Pharmacy Patient Advocate Encounter  Received notification from [employer] that Prior Authorization for repatha has been APPROVED from 10/24/24 to 04/23/25   PA #/Case ID/Reference #: [id number]

## 2024-10-24 NOTE — Telephone Encounter (Signed)
 Pharmacy Patient Advocate Encounter   Received notification from [facility] KEY that prior authorization for repatha is required/requested.   Insurance verification completed.   The patient is insured through [facility].   Per test claim: PA required; PA submitted to above mentioned insurance via Latent Key/confirmation #/EOC [id number] Status is pending

## 2024-10-25 ENCOUNTER — Telehealth: Payer: Self-pay | Admitting: Pharmacy Technician

## 2024-10-25 ENCOUNTER — Other Ambulatory Visit (HOSPITAL_COMMUNITY): Payer: Self-pay

## 2024-10-25 DIAGNOSIS — I2510 Atherosclerotic heart disease of native coronary artery without angina pectoris: Secondary | ICD-10-CM

## 2024-10-25 DIAGNOSIS — E7800 Pure hypercholesterolemia, unspecified: Secondary | ICD-10-CM

## 2024-10-25 DIAGNOSIS — Z951 Presence of aortocoronary bypass graft: Secondary | ICD-10-CM

## 2024-10-25 NOTE — Telephone Encounter (Signed)
 Pharmacy Patient Advocate Encounter   Received notification from [facility] KEY that prior authorization for nexlizet  is required/requested.   Insurance verification completed.   The patient is insured through [facility] [employer].   Per test claim: PA required; PA submitted to above mentioned insurance via Latent Key/confirmation #/EOC [id number] Status is pending

## 2024-10-26 NOTE — Telephone Encounter (Signed)
 Pt aware that he needs to have labs for insurance approval for Nexlizet .

## 2024-10-26 NOTE — Telephone Encounter (Signed)
 Pharmacy Patient Advocate Encounter  Received notification from [employer] that Prior Authorization for nexlizet  has been DENIED.  Full denial letter will be uploaded to the media tab. See denial reason below.   PA #/Case ID/Reference #: [id number]     LDL-C values within the last 120 days   The last ldl found was 05/30/24 which was more than 120 days ago. Can the patient please get updated ldl labs so we can appeal the denial. Thank you!

## 2024-11-01 ENCOUNTER — Ambulatory Visit: Payer: Self-pay | Attending: Cardiology

## 2024-11-01 DIAGNOSIS — Z951 Presence of aortocoronary bypass graft: Secondary | ICD-10-CM | POA: Diagnosis not present

## 2024-11-01 DIAGNOSIS — I2510 Atherosclerotic heart disease of native coronary artery without angina pectoris: Secondary | ICD-10-CM | POA: Diagnosis not present

## 2024-11-01 LAB — ECHOCARDIOGRAM COMPLETE
AR max vel: 3.48 cm2
AV Area VTI: 3.2 cm2
AV Area mean vel: 3.38 cm2
AV Mean grad: 1 mmHg
AV Peak grad: 2.6 mmHg
Ao pk vel: 0.81 m/s
Area-P 1/2: 4.83 cm2
Calc EF: 41.8 %
MV VTI: 1.76 cm2
S' Lateral: 4.2 cm
Single Plane A2C EF: 44 %
Single Plane A4C EF: 45.3 %

## 2024-11-01 LAB — LIPID PANEL
Chol/HDL Ratio: 3.9 ratio (ref 0.0–5.0)
Cholesterol, Total: 122 mg/dL (ref 100–199)
HDL: 31 mg/dL — ABNORMAL LOW
LDL Chol Calc (NIH): 61 mg/dL (ref 0–99)
Triglycerides: 178 mg/dL — ABNORMAL HIGH (ref 0–149)
VLDL Cholesterol Cal: 30 mg/dL (ref 5–40)

## 2024-11-01 LAB — AST: AST: 39 IU/L (ref 0–40)

## 2024-11-01 LAB — ALT: ALT: 23 IU/L (ref 0–44)

## 2024-11-01 MED ORDER — PERFLUTREN LIPID MICROSPHERE
1.0000 mL | INTRAVENOUS | Status: AC | PRN
  Administered 2024-11-01: 4 mL via INTRAVENOUS

## 2024-11-03 NOTE — Telephone Encounter (Signed)
" ° °  Faxed updated labs 11/03/24 "

## 2024-11-08 ENCOUNTER — Other Ambulatory Visit (HOSPITAL_COMMUNITY): Payer: Self-pay

## 2024-11-08 ENCOUNTER — Ambulatory Visit: Payer: Self-pay | Admitting: Cardiology

## 2024-11-08 NOTE — Telephone Encounter (Signed)
 Requested follow up on appeals with insurance

## 2024-11-09 ENCOUNTER — Other Ambulatory Visit (HOSPITAL_COMMUNITY): Payer: Self-pay

## 2024-11-10 NOTE — Telephone Encounter (Signed)
 Pharmacy Patient Advocate Encounter  Received notification from [employer] that Prior Authorization for nexlizet  has been APPROVED from 11/09/24 to 05/03/25   PA #/Case ID/Reference #:      Asked [place] to fill
